# Patient Record
Sex: Male | Born: 1945
Health system: Southern US, Community
[De-identification: ages and names within clinical notes are randomized; demographics above are authoritative.]

## PROBLEM LIST (undated history)

## (undated) DIAGNOSIS — N200 Calculus of kidney: Secondary | ICD-10-CM

## (undated) DIAGNOSIS — R238 Other skin changes: Secondary | ICD-10-CM

## (undated) DIAGNOSIS — B019 Varicella without complication: Secondary | ICD-10-CM

## (undated) DIAGNOSIS — I10 Essential (primary) hypertension: Secondary | ICD-10-CM

## (undated) HISTORY — DX: Varicella without complication: B01.9

## (undated) HISTORY — DX: Calculus of kidney: N20.0

## (undated) HISTORY — PX: SKIN SURGERY: SHX2413

---

## 2013-11-09 ENCOUNTER — Ambulatory Visit: Payer: Self-pay | Admitting: Unknown Physician Specialty

## 2015-03-18 HISTORY — PX: ROTATOR CUFF REPAIR: SHX139

## 2016-01-03 ENCOUNTER — Telehealth: Payer: Self-pay | Admitting: Family Medicine

## 2016-01-03 NOTE — Telephone Encounter (Signed)
Thank you :)

## 2016-01-03 NOTE — Telephone Encounter (Signed)
Pt lvm stating he was returning Rasheeda's phone call. Please call back. Thank you

## 2016-01-08 ENCOUNTER — Encounter: Payer: Self-pay | Admitting: Family Medicine

## 2016-01-08 ENCOUNTER — Ambulatory Visit (INDEPENDENT_AMBULATORY_CARE_PROVIDER_SITE_OTHER): Payer: PPO | Admitting: Family Medicine

## 2016-01-08 VITALS — BP 136/78 | HR 65 | Temp 97.8°F | Ht 68.0 in | Wt 178.6 lb

## 2016-01-08 DIAGNOSIS — Z87442 Personal history of urinary calculi: Secondary | ICD-10-CM | POA: Diagnosis not present

## 2016-01-08 DIAGNOSIS — Z23 Encounter for immunization: Secondary | ICD-10-CM

## 2016-01-08 DIAGNOSIS — E663 Overweight: Secondary | ICD-10-CM | POA: Insufficient documentation

## 2016-01-08 NOTE — Patient Instructions (Signed)
Nice to meet you. We will have the return for lab work some morning when you've not eaten. Please continue to exercise daily. Please continue plenty of vegetables and proteins. Please monitor for recurrence of kidney stones. Once your lab work comes back we will determine when you need a follow-up.

## 2016-01-08 NOTE — Progress Notes (Signed)
Patient ID: Henry Skinner, male   DOB: Dec 13, 1945, 70 y.o.   MRN: [id number]  Tommi Rumps, MD Phone: [phone]  Henry Skinner is a 70 y.o. male who presents today for new patient visit and to establish care.  Overweight: Patient's BMI is 27. He notes he exercises daily by walking 1 to 2 miles. Diet consists of oatmeal-based cereal for breakfast. Eats 3 meals a day. Half of which are vegetables. Half protein. Some grains.  History of kidney stones: Patient notes 3-4 years ago had 2 kidney stones. States they were from the minerals in his well water. He has been using different water and has not had recurrence since then. No urinary symptoms.  Active Ambulatory Problems    Diagnosis Date Noted  . Overweight 01/08/2016  . History of kidney stones 01/08/2016   Resolved Ambulatory Problems    Diagnosis Date Noted  . No Resolved Ambulatory Problems   Past Medical History  Diagnosis Date  . Chickenpox   . Kidney stones     Family History  Problem Relation Age of Onset  . Alcoholism Brother   . Colon cancer Maternal Uncle     Social History   Social History  . Marital Status: Single    Spouse Name: N/A  . Number of Children: N/A  . Years of Education: N/A   Occupational History  . Not on file.   Social History Main Topics  . Smoking status: Never Smoker   . Smokeless tobacco: Not on file  . Alcohol Use: 0.6 oz/week    1 Standard drinks or equivalent per week     Comment: 1 beer a day   . Drug Use: Yes     Comment: marijuana, rarely  . Sexual Activity: Not on file   Other Topics Concern  . Not on file   Social History Narrative  . No narrative on file    ROS   General:  Negative for nexplained weight loss, fever Skin: Negative for new or changing mole, sore that won't heal HEENT: Negative for trouble hearing, trouble seeing, ringing in ears, mouth sores, hoarseness, change in voice, dysphagia. CV:  Negative for chest pain, dyspnea, edema,  palpitations Resp: Negative for cough, dyspnea, hemoptysis GI: Negative for nausea, vomiting, diarrhea, constipation, abdominal pain, melena, hematochezia. GU: Negative for dysuria, incontinence, urinary hesitance, hematuria, vaginal or penile discharge, polyuria, sexual difficulty, lumps in testicle or breasts MSK: Negative for muscle cramps or aches, joint pain or swelling Neuro: Negative for headaches, weakness, numbness, dizziness, passing out/fainting Psych: Negative for depression, anxiety, memory problems  Objective  Physical Exam Filed Vitals:   01/08/16 0929  BP: 136/78  Pulse: 65  Temp: 97.8 F (36.6 C)    BP Readings from Last 3 Encounters:  01/08/16 136/78   Wt Readings from Last 3 Encounters:  01/08/16 178 lb 9.6 oz (81.012 kg)    Physical Exam  Constitutional: He is well-developed, well-nourished, and in no distress.  HENT:  Head: Normocephalic and atraumatic.  Right Ear: External ear normal.  Left Ear: External ear normal.  Mouth/Throat: Oropharynx is clear and moist. No oropharyngeal exudate.  Eyes: Conjunctivae are normal. Pupils are equal, round, and reactive to light.  Neck: Neck supple.  Cardiovascular: Normal rate, regular rhythm and normal heart sounds.  Exam reveals no gallop and no friction rub.   No murmur heard. Pulmonary/Chest: Effort normal and breath sounds normal. No respiratory distress. He has no wheezes. He has no rales.  Abdominal: Soft. Bowel sounds are normal.  He exhibits no distension. There is no tenderness. There is no rebound and no guarding.  Musculoskeletal: He exhibits no edema.  Lymphadenopathy:    He has no cervical adenopathy.  Neurological: He is alert. Gait normal.  Skin: Skin is dry. He is not diaphoretic.  Psychiatric: Mood and affect normal.     Assessment/Plan:   Overweight BMI of 27. Discussed continuing diet and exercise. Patient will continue to monitor. We'll check lipid panel and CMP.  History of kidney  stones Has not had recurrence of the last 3-4 years. Has changed his water system and this is been beneficial. No symptoms at this time. Patient will continue to monitor.    Orders Placed This Encounter  Procedures  . Tdap vaccine greater than or equal to 7yo IM  . Lipid Profile    Standing Status: Future     Number of Occurrences:      Standing Expiration Date: 01/07/2017  . Comp Met (CMET)    Standing Status: Future     Number of Occurrences:      Standing Expiration Date: 01/07/2017  . HgB A1c    Meds ordered this encounter  Medications  . Multiple Vitamin (MULTIVITAMIN) tablet    Sig: Take 1 tablet by mouth daily.   Maintenance-updated in health maintenance tab. Tdap given today. Stool cards given as well. Patient declined colonoscopy.  Tommi Rumps

## 2016-01-08 NOTE — Assessment & Plan Note (Signed)
Has not had recurrence of the last 3-4 years. Has changed his water system and this is been beneficial. No symptoms at this time. Patient will continue to monitor.

## 2016-01-08 NOTE — Progress Notes (Signed)
Pre visit review using our clinic review tool, if applicable. No additional management support is needed unless otherwise documented below in the visit note. 

## 2016-01-08 NOTE — Assessment & Plan Note (Signed)
BMI of 27. Discussed continuing diet and exercise. Patient will continue to monitor. We'll check lipid panel and CMP.

## 2016-01-17 ENCOUNTER — Other Ambulatory Visit (INDEPENDENT_AMBULATORY_CARE_PROVIDER_SITE_OTHER): Payer: PPO

## 2016-01-17 ENCOUNTER — Other Ambulatory Visit: Payer: PPO

## 2016-01-17 DIAGNOSIS — E663 Overweight: Secondary | ICD-10-CM | POA: Diagnosis not present

## 2016-01-17 LAB — COMPREHENSIVE METABOLIC PANEL
ALBUMIN: 4.2 g/dL (ref 3.5–5.2)
ALK PHOS: 62 U/L (ref 39–117)
ALT: 33 U/L (ref 0–53)
AST: 28 U/L (ref 0–37)
BILIRUBIN TOTAL: 0.9 mg/dL (ref 0.2–1.2)
BUN: 12 mg/dL (ref 6–23)
CALCIUM: 9.4 mg/dL (ref 8.4–10.5)
CO2: 30 meq/L (ref 19–32)
Chloride: 103 mEq/L (ref 96–112)
Creatinine, Ser: 1.02 mg/dL (ref 0.40–1.50)
GFR: 77.26 mL/min (ref >60.00)
Glucose, Bld: 99 mg/dL (ref 70–99)
Potassium: 4.1 mEq/L (ref 3.5–5.1)
Sodium: 140 mEq/L (ref 135–145)
TOTAL PROTEIN: 7.2 g/dL (ref 6.0–8.3)

## 2016-01-17 LAB — LIPID PANEL
CHOL/HDL RATIO: 3
CHOLESTEROL: 166 mg/dL (ref 0–200)
HDL: 51.5 mg/dL (ref >39.00)
LDL Cholesterol: 99 mg/dL (ref 0–99)
NonHDL: 114.66
TRIGLYCERIDES: 78 mg/dL (ref 0.0–149.0)
VLDL: 15.6 mg/dL (ref 0.0–40.0)

## 2016-02-01 ENCOUNTER — Ambulatory Visit (INDEPENDENT_AMBULATORY_CARE_PROVIDER_SITE_OTHER): Payer: PPO | Admitting: Family Medicine

## 2016-02-01 ENCOUNTER — Encounter: Payer: Self-pay | Admitting: Family Medicine

## 2016-02-01 VITALS — BP 126/74 | HR 78 | Temp 98.0°F | Ht 68.0 in | Wt 175.4 lb

## 2016-02-01 DIAGNOSIS — R220 Localized swelling, mass and lump, head: Secondary | ICD-10-CM | POA: Diagnosis not present

## 2016-02-01 DIAGNOSIS — K118 Other diseases of salivary glands: Secondary | ICD-10-CM

## 2016-02-01 NOTE — Assessment & Plan Note (Signed)
Patient with left parotid mass on exam. Prior CT scan reviewed and revealed likely neoplasm recommending biopsy at that time. Patient has not proceeded with this. We will have him return to see his previous ear nose and throat physician for further evaluation to determine management of this issue. He is given return precautions.

## 2016-02-01 NOTE — Progress Notes (Signed)
Pre visit review using our clinic review tool, if applicable. No additional management support is needed unless otherwise documented below in the visit note. 

## 2016-02-01 NOTE — Progress Notes (Signed)
Patient ID: Henry Skinner, male   DOB: Mar 20, 1946, 70 y.o.   MRN: [id number]  Tommi Rumps, MD Phone: [phone]  Henry Skinner is a 70 y.o. male who presents today for same-day visit.  Patient notes a mass just anterior to his left ear that has been present for a number of years. Previously evaluated by ENT for this and had a CT scan. At that time it appears that biopsy was recommended though the patient did not follow up on this. He notes over the last week he felt maybe it was causing a pressure sensation in his inner year, though this is resolved. Soft tissue swelling has slightly enlarged over the years. It is nonpainful. He denies hearing loss and tinnitus. No swallowing or speaking issues. No facial muscle changes or sensory changes in his face. CT scan was reviewed from 2014.  PMH: nonsmoker.   ROS see history of present illness  Objective  Physical Exam Filed Vitals:   02/01/16 1358  BP: 126/74  Pulse: 78  Temp: 98 F (36.7 C)    BP Readings from Last 3 Encounters:  02/01/16 126/74  01/08/16 136/78   Wt Readings from Last 3 Encounters:  02/01/16 175 lb 6.4 oz (79.561 kg)  01/08/16 178 lb 9.6 oz (81.012 kg)    Physical Exam  Constitutional: He is well-developed, well-nourished, and in no distress.  HENT:  Head: Normocephalic and atraumatic.  Right Ear: External ear normal.  Left Ear: External ear normal.  Mouth/Throat: Oropharynx is clear and moist. No oropharyngeal exudate.  Left parotid gland with mass noted that is nontender and nonerythematous, no fluctuance, no induration, mass is firm and freely mobile, no cervical lymphadenopathy  Eyes: Conjunctivae are normal. Pupils are equal, round, and reactive to light.  Neck: Neck supple.  Cardiovascular: Normal rate, regular rhythm and normal heart sounds.  Exam reveals no gallop and no friction rub.   No murmur heard. Pulmonary/Chest: Effort normal and breath sounds normal. No respiratory distress. He has no  wheezes. He has no rales.  Lymphadenopathy:    He has no cervical adenopathy.  Neurological: He is alert. Gait normal.  Cranial nerves V and VII intact  Skin: Skin is warm and dry. He is not diaphoretic.     Assessment/Plan: Please see individual problem list.  Parotid mass Patient with left parotid mass on exam. Prior CT scan reviewed and revealed likely neoplasm recommending biopsy at that time. Patient has not proceeded with this. We will have him return to see his previous ear nose and throat physician for further evaluation to determine management of this issue. He is given return precautions.    Orders Placed This Encounter  Procedures  . Ambulatory referral to ENT    Referral Priority:  Routine    Referral Type:  Consultation    Referral Reason:  Specialty Services Required    Requested Specialty:  Otolaryngology    Number of Visits Requested:  [place], MD [place]

## 2016-02-01 NOTE — Patient Instructions (Signed)
Nice to see you. We needed to be evaluated by an ear nose and throat physician. We will refer you back to see Dr. Tami Ribas. If you develop pain in the mass, issues hearing, trouble swallowing, or any new or changing symptoms please seek medical attention.

## 2016-02-04 ENCOUNTER — Other Ambulatory Visit: Payer: Self-pay | Admitting: Unknown Physician Specialty

## 2016-02-04 DIAGNOSIS — D37030 Neoplasm of uncertain behavior of the parotid salivary glands: Secondary | ICD-10-CM

## 2016-02-04 DIAGNOSIS — R221 Localized swelling, mass and lump, neck: Secondary | ICD-10-CM

## 2016-02-12 ENCOUNTER — Ambulatory Visit: Payer: PPO

## 2016-02-14 ENCOUNTER — Ambulatory Visit (INDEPENDENT_AMBULATORY_CARE_PROVIDER_SITE_OTHER): Payer: PPO | Admitting: Family Medicine

## 2016-02-14 ENCOUNTER — Encounter: Payer: Self-pay | Admitting: Family Medicine

## 2016-02-14 VITALS — BP 132/90 | HR 68 | Temp 98.0°F | Wt 175.5 lb

## 2016-02-14 DIAGNOSIS — W57XXXA Bitten or stung by nonvenomous insect and other nonvenomous arthropods, initial encounter: Secondary | ICD-10-CM

## 2016-02-14 DIAGNOSIS — T148 Other injury of unspecified body region: Secondary | ICD-10-CM | POA: Diagnosis not present

## 2016-02-14 MED ORDER — TRIAMCINOLONE ACETONIDE 0.1 % EX OINT: 1.0000 "application " | TOPICAL_OINTMENT | Freq: Two times a day (BID) | CUTANEOUS | Status: DC

## 2016-02-14 NOTE — Progress Notes (Signed)
Pre visit review using our clinic review tool, if applicable. No additional management support is needed unless otherwise documented below in the visit note. 

## 2016-02-14 NOTE — Assessment & Plan Note (Signed)
New problem. Patient with no signs or symptoms of Lyme, RMSF, ehrlichiosis. I informed him that the likelihood of tickborne illness is very small in the absence of other signs or symptoms. Will not proceed with antibiotics at this time. Advised supportive care and topical triamcinolone if he so desires.

## 2016-02-14 NOTE — Progress Notes (Signed)
   Subjective:  Patient ID: Henry Skinner, male    DOB: 11-09-1946  Age: 70 y.o. MRN: [id number]  CC: Tick bites  HPI:  70 year old male presents to clinic today with the above concern.  Tick bites  Patient reports that he was bitten by 2 tics on Monday.  One bite was located on the left medial thigh and the other was in the axilla.  He's had no fevers or chills since that time.  No focal rash.  No other symptoms.  He states that he's also suffers some other bites from chiggers.  He is concerned he may be developing Lyme disease or another tickborne illness and presents for evaluation.  Social Hx   Social History   Social History  . Marital Status: Single    Spouse Name: N/A  . Number of Children: N/A  . Years of Education: N/A   Social History Main Topics  . Smoking status: Never Smoker   . Smokeless tobacco: None  . Alcohol Use: 0.6 oz/week    1 Standard drinks or equivalent per week     Comment: 1 beer a day   . Drug Use: Yes     Comment: marijuana, rarely  . Sexual Activity: Not Asked   Other Topics Concern  . None   Social History Narrative   Review of Systems  Constitutional: Negative.   Gastrointestinal: Negative.   Skin: Negative for rash.       Tick bites.   Objective:  BP 132/90 mmHg  Pulse 68  Temp(Src) 98 F (36.7 C) (Oral)  Wt 175 lb 8 oz (79.606 kg)  SpO2 96%  BP/Weight 02/14/2016 02/01/2016 [id number]  Systolic BP [id number]  Diastolic BP 90 74 78  Wt. (Lbs) 175.5 175.4 178.6  BMI 26.69 26.68 27.16   Physical Exam  Constitutional: He is oriented to person, place, and time. He appears well-developed. No distress.  Pulmonary/Chest: Effort normal.  Neurological: He is alert and oriented to person, place, and time.  Skin:  Numerous bug bites noted. 2 areas from tick bites have mild surrounding erythema. No focal rash.  Psychiatric: He has a normal mood and affect.  Vitals reviewed.  Lab Results  Component Value Date   GLUCOSE  99 01/17/2016   CHOL 166 01/17/2016   TRIG 78.0 01/17/2016   HDL 51.50 01/17/2016   LDLCALC 99 01/17/2016   ALT 33 01/17/2016   AST 28 01/17/2016   NA 140 01/17/2016   K 4.1 01/17/2016   CL 103 01/17/2016   CREATININE 1.02 01/17/2016   BUN 12 01/17/2016   CO2 30 01/17/2016    Assessment & Plan:   Problem List Items Addressed This Visit    Tick bite - Primary    New problem. Patient with no signs or symptoms of Lyme, RMSF, ehrlichiosis. I informed him that the likelihood of tickborne illness is very small in the absence of other signs or symptoms. Will not proceed with antibiotics at this time. Advised supportive care and topical triamcinolone if he so desires.         Meds ordered this encounter  Medications  . VITAMIN D, CHOLECALCIFEROL, PO    Sig: Take 2,000 Units by mouth.  . triamcinolone ointment (KENALOG) 0.1 %    Sig: Apply 1 application topically 2 (two) times daily.    Dispense:  30 g    Refill:  0    Follow-up: PRN  [place]

## 2016-02-14 NOTE — Patient Instructions (Signed)
Use the topical ointment twice daily as needed for your bites.  No need for treatment regarding the tick bite at this time.  Please let us know if you worsen or something changes.  Take care  Dr. Lacinda Axon

## 2016-02-18 ENCOUNTER — Ambulatory Visit
Admission: RE | Admit: 2016-02-18 | Discharge: 2016-02-18 | Disposition: A | Discharge status: Home / Self Care | Payer: PPO | Source: Ambulatory Visit | Attending: Unknown Physician Specialty | Admitting: Unknown Physician Specialty

## 2016-02-18 DIAGNOSIS — D37030 Neoplasm of uncertain behavior of the parotid salivary glands: Secondary | ICD-10-CM

## 2016-02-18 DIAGNOSIS — R221 Localized swelling, mass and lump, neck: Secondary | ICD-10-CM | POA: Diagnosis not present

## 2016-02-18 MED ORDER — IOPAMIDOL (ISOVUE-370) INJECTION 76%
75.0000 mL | Freq: Once | INTRAVENOUS | Status: AC | PRN
  Administered 2016-02-18: 75 mL via INTRAVENOUS

## 2016-02-20 DIAGNOSIS — D37030 Neoplasm of uncertain behavior of the parotid salivary glands: Secondary | ICD-10-CM | POA: Diagnosis not present

## 2016-02-25 ENCOUNTER — Encounter
Admission: RE | Admit: 2016-02-25 | Discharge: 2016-02-25 | Disposition: A | Discharge status: Home / Self Care | Payer: PPO | Source: Ambulatory Visit | Attending: Unknown Physician Specialty | Admitting: Unknown Physician Specialty

## 2016-02-25 DIAGNOSIS — Z01810 Encounter for preprocedural cardiovascular examination: Secondary | ICD-10-CM | POA: Insufficient documentation

## 2016-02-25 HISTORY — DX: Other skin changes: R23.8

## 2016-02-25 NOTE — Patient Instructions (Signed)
  Your procedure is scheduled on: 03/04/16 Tues Report to Day Surgery.2nd floor medical mall To find out your arrival time please call [phone] between 1PM - 3PM on 03/03/16 Mon.  Remember: Instructions that are not followed completely may result in serious medical risk, up to and including death, or upon the discretion of your surgeon and anesthesiologist your surgery may need to be rescheduled.    _x___ 1. Do not eat food or drink liquids after midnight. No gum chewing or hard candies.     ____ 2. No Alcohol for 24 hours before or after surgery.   ____ 3. Bring all medications with you on the day of surgery if instructed.    _x__ 4. Notify your doctor if there is any change in your medical condition     (cold, fever, infections).     Do not wear jewelry, make-up, hairpins, clips or nail polish.  Do not wear lotions, powders, or perfumes. You may wear deodorant.  Do not shave 48 hours prior to surgery. Men may shave face and neck.  Do not bring valuables to the hospital.    [facility] is not responsible for any belongings or valuables.               Contacts, dentures or bridgework may not be worn into surgery.  Leave your suitcase in the car. After surgery it may be brought to your room.  For patients admitted to the hospital, discharge time is determined by your                treatment team.   Patients discharged the day of surgery will not be allowed to drive home.   Please read over the following fact sheets that you were given:      ____ Take these medicines the morning of surgery with A SIP OF WATER:    1. none  2.   3.   4.  5.  6.  ____ Fleet Enema (as directed)   _x___ Use CHG Soap as directed  ____ Use inhalers on the day of surgery  ____ Stop metformin 2 days prior to surgery    ____ Take 1/2 of usual insulin dose the night before surgery and none on the morning of surgery.   ____ Stop Coumadin/Plavix/aspirin on   ____ Stop Anti-inflammatories on     ____ Stop supplements until after surgery.    ____ Bring C-Pap to the hospital.

## 2016-02-26 ENCOUNTER — Other Ambulatory Visit: Payer: PPO

## 2016-03-04 ENCOUNTER — Ambulatory Visit
Admission: RE | Admit: 2016-03-04 | Discharge: 2016-03-04 | Disposition: A | Discharge status: Home / Self Care | Payer: PPO | Source: Ambulatory Visit | Attending: Unknown Physician Specialty | Admitting: Unknown Physician Specialty

## 2016-03-04 ENCOUNTER — Encounter
Admission: RE | Disposition: A | Discharge status: Home / Self Care | Payer: Self-pay | Source: Ambulatory Visit | Attending: Unknown Physician Specialty

## 2016-03-04 ENCOUNTER — Ambulatory Visit: Payer: PPO | Admitting: Anesthesiology

## 2016-03-04 ENCOUNTER — Encounter: Payer: Self-pay | Admitting: Anesthesiology

## 2016-03-04 DIAGNOSIS — D490 Neoplasm of unspecified behavior of digestive system: Secondary | ICD-10-CM | POA: Diagnosis not present

## 2016-03-04 DIAGNOSIS — Z9889 Other specified postprocedural states: Secondary | ICD-10-CM | POA: Insufficient documentation

## 2016-03-04 DIAGNOSIS — Z01810 Encounter for preprocedural cardiovascular examination: Secondary | ICD-10-CM | POA: Diagnosis not present

## 2016-03-04 DIAGNOSIS — Z8249 Family history of ischemic heart disease and other diseases of the circulatory system: Secondary | ICD-10-CM | POA: Insufficient documentation

## 2016-03-04 DIAGNOSIS — K118 Other diseases of salivary glands: Secondary | ICD-10-CM

## 2016-03-04 DIAGNOSIS — K119 Disease of salivary gland, unspecified: Secondary | ICD-10-CM | POA: Diagnosis not present

## 2016-03-04 DIAGNOSIS — D110 Benign neoplasm of parotid gland: Secondary | ICD-10-CM | POA: Insufficient documentation

## 2016-03-04 HISTORY — PX: PAROTIDECTOMY: SHX2163

## 2016-03-04 LAB — URINE DRUG SCREEN, QUALITATIVE (ARMC ONLY)
Amphetamines, Ur Screen: NOT DETECTED
BARBITURATES, UR SCREEN: NOT DETECTED
Benzodiazepine, Ur Scrn: NOT DETECTED
CANNABINOID 50 NG, UR ~~LOC~~: NOT DETECTED
COCAINE METABOLITE, UR ~~LOC~~: NOT DETECTED
MDMA (ECSTASY) UR SCREEN: NOT DETECTED
Methadone Scn, Ur: NOT DETECTED
OPIATE, UR SCREEN: NOT DETECTED
PHENCYCLIDINE (PCP) UR S: NOT DETECTED
TRICYCLIC, UR SCREEN: NOT DETECTED

## 2016-03-04 SURGERY — EXCISION, PAROTID GLAND
Anesthesia: General | Laterality: Left | Wound class: Clean

## 2016-03-04 MED ORDER — ONDANSETRON HCL 4 MG/2ML IJ SOLN: 4.0000 mg | Freq: Once | INTRAMUSCULAR | Status: DC | PRN

## 2016-03-04 MED ORDER — FAMOTIDINE 20 MG PO TABS
20.0000 mg | ORAL_TABLET | Freq: Once | ORAL | Status: AC
  Administered 2016-03-04: 20 mg via ORAL

## 2016-03-04 MED ORDER — PROPOFOL 10 MG/ML IV BOLUS
INTRAVENOUS | Status: DC | PRN
  Administered 2016-03-04: 170 mg via INTRAVENOUS

## 2016-03-04 MED ORDER — OXYCODONE-ACETAMINOPHEN 5-325 MG PO TABS: 1.0000 | ORAL_TABLET | ORAL | Status: DC | PRN

## 2016-03-04 MED ORDER — GLYCOPYRROLATE 0.2 MG/ML IJ SOLN
INTRAMUSCULAR | Status: DC | PRN
  Administered 2016-03-04: 0.2 mg via INTRAVENOUS

## 2016-03-04 MED ORDER — LIDOCAINE-EPINEPHRINE 1 %-1:100000 IJ SOLN
INTRAMUSCULAR | Status: AC
  Filled 2016-03-04: qty 1

## 2016-03-04 MED ORDER — PHENYLEPHRINE HCL 10 MG/ML IJ SOLN
INTRAMUSCULAR | Status: DC | PRN
  Administered 2016-03-04 (×2): 50 ug via INTRAVENOUS

## 2016-03-04 MED ORDER — BACITRACIN ZINC 500 UNIT/GM EX OINT
TOPICAL_OINTMENT | CUTANEOUS | Status: AC
  Filled 2016-03-04: qty 28.35

## 2016-03-04 MED ORDER — MIDAZOLAM HCL 2 MG/2ML IJ SOLN
INTRAMUSCULAR | Status: DC | PRN
  Administered 2016-03-04: 2 mg via INTRAVENOUS

## 2016-03-04 MED ORDER — LACTATED RINGERS IV SOLN
INTRAVENOUS | Status: DC
  Administered 2016-03-04 (×2): via INTRAVENOUS

## 2016-03-04 MED ORDER — ONDANSETRON HCL 4 MG/2ML IJ SOLN
INTRAMUSCULAR | Status: DC | PRN
  Administered 2016-03-04: 4 mg via INTRAVENOUS

## 2016-03-04 MED ORDER — CEPHALEXIN 500 MG PO CAPS: 500.0000 mg | ORAL_CAPSULE | Freq: Two times a day (BID) | ORAL | Status: DC

## 2016-03-04 MED ORDER — FAMOTIDINE 20 MG PO TABS
ORAL_TABLET | ORAL | Status: AC
  Administered 2016-03-04: 20 mg via ORAL
  Filled 2016-03-04: qty 1

## 2016-03-04 MED ORDER — FENTANYL CITRATE (PF) 100 MCG/2ML IJ SOLN
25.0000 ug | INTRAMUSCULAR | Status: DC | PRN
  Administered 2016-03-04 (×4): 25 ug via INTRAVENOUS

## 2016-03-04 MED ORDER — FENTANYL CITRATE (PF) 100 MCG/2ML IJ SOLN
INTRAMUSCULAR | Status: DC | PRN
  Administered 2016-03-04: 50 ug via INTRAVENOUS
  Administered 2016-03-04: 100 ug via INTRAVENOUS
  Administered 2016-03-04: 50 ug via INTRAVENOUS
  Administered 2016-03-04: 100 ug via INTRAVENOUS

## 2016-03-04 MED ORDER — DEXAMETHASONE SODIUM PHOSPHATE 10 MG/ML IJ SOLN
INTRAMUSCULAR | Status: DC | PRN
  Administered 2016-03-04: 10 mg via INTRAVENOUS

## 2016-03-04 MED ORDER — LIDOCAINE-EPINEPHRINE 1 %-1:100000 IJ SOLN
INTRAMUSCULAR | Status: DC | PRN
  Administered 2016-03-04: 4 mL

## 2016-03-04 MED ORDER — SUCCINYLCHOLINE CHLORIDE 20 MG/ML IJ SOLN
INTRAMUSCULAR | Status: DC | PRN
  Administered 2016-03-04: 100 mg via INTRAVENOUS

## 2016-03-04 MED ORDER — FENTANYL CITRATE (PF) 100 MCG/2ML IJ SOLN
INTRAMUSCULAR | Status: AC
  Filled 2016-03-04: qty 2

## 2016-03-04 MED ORDER — LIDOCAINE HCL (CARDIAC) 20 MG/ML IV SOLN
INTRAVENOUS | Status: DC | PRN
  Administered 2016-03-04: 80 mg via INTRAVENOUS

## 2016-03-04 SURGICAL SUPPLY — 42 items
ADHESIVE MASTISOL STRL (MISCELLANEOUS) IMPLANT
BLADE SURG 15 STRL LF DISP TIS (BLADE) ×1 IMPLANT
BLADE SURG 15 STRL SS (BLADE) ×2
CANISTER SUCT 1200ML W/VALVE (MISCELLANEOUS) ×3 IMPLANT
CORD BIP STRL DISP 12FT (MISCELLANEOUS) ×3 IMPLANT
DRAIN TLS ROUND 10FR (DRAIN) IMPLANT
DRAPE MAG INST 16X20 L/F (DRAPES) ×3 IMPLANT
DRAPE SURG 17X11 SM STRL (DRAPES) ×3 IMPLANT
DRSG TEGADERM 2-3/8X2-3/4 SM (GAUZE/BANDAGES/DRESSINGS) ×9 IMPLANT
ELECT CAUTERY BLADE TIP 2.5 (TIP) ×3
ELECT EMG 20MM DUAL (MISCELLANEOUS) ×6
ELECT NEEDLE 20X.3 GREEN (MISCELLANEOUS) ×3
ELECT REM PT RETURN 9FT ADLT (ELECTROSURGICAL) ×3
ELECTRODE CAUTERY BLDE TIP 2.5 (TIP) ×1 IMPLANT
ELECTRODE EMG 20MM DUAL (MISCELLANEOUS) ×2 IMPLANT
ELECTRODE NEEDLE 20X.3 GREEN (MISCELLANEOUS) ×1 IMPLANT
ELECTRODE REM PT RTRN 9FT ADLT (ELECTROSURGICAL) ×1 IMPLANT
FORCEPS JEWEL BIP 4-3/4 STR (INSTRUMENTS) ×3 IMPLANT
GLOVE BIO SURGEON STRL SZ7.5 (GLOVE) ×6 IMPLANT
GOWN STRL REUS W/ TWL LRG LVL3 (GOWN DISPOSABLE) ×2 IMPLANT
GOWN STRL REUS W/TWL LRG LVL3 (GOWN DISPOSABLE) ×4
HARMONIC SCALPEL FOCUS (MISCELLANEOUS) ×3 IMPLANT
HEMOSTAT SURGICEL 2X3 (HEMOSTASIS) ×3 IMPLANT
HOOK STAY 5M SHARP BLUNT 3316- (MISCELLANEOUS) ×3 IMPLANT
JACKSON PRATT 7MM (INSTRUMENTS) ×3 IMPLANT
KIT RM TURNOVER STRD PROC AR (KITS) ×3 IMPLANT
LABEL OR SOLS (LABEL) IMPLANT
LIQUID BAND (GAUZE/BANDAGES/DRESSINGS) ×3 IMPLANT
MARKER SKIN DUAL TIP RULER LAB (MISCELLANEOUS) ×3 IMPLANT
NS IRRIG 500ML POUR BTL (IV SOLUTION) ×3 IMPLANT
PACK HEAD/NECK (MISCELLANEOUS) ×3 IMPLANT
PROBE MONO 100X0.75 ELECT 1.9M (MISCELLANEOUS) ×3 IMPLANT
SPONGE KITTNER 5P (MISCELLANEOUS) ×6 IMPLANT
SPONGE XRAY 4X4 16PLY STRL (MISCELLANEOUS) ×6 IMPLANT
STAPLER SKIN PROX 35W (STAPLE) ×3 IMPLANT
SUT PROLENE 5 0 PS 3 (SUTURE) ×3 IMPLANT
SUT SILK 2 0 (SUTURE) ×2
SUT SILK 2-0 30XBRD TIE 12 (SUTURE) ×1 IMPLANT
SUT SILK 3 0 (SUTURE) ×2
SUT SILK 3-0 18XBRD TIE 12 (SUTURE) ×1 IMPLANT
SUT VIC AB 4-0 RB1 18 (SUTURE) ×6 IMPLANT
SYSTEM CHEST DRAIN TLS 7FR (DRAIN) IMPLANT

## 2016-03-04 NOTE — Anesthesia Postprocedure Evaluation (Signed)
Anesthesia Post Note  Patient: Henry Skinner  Procedure(s) Performed: Procedure(s) (LRB): PAROTIDECTOMY (Left)  Patient location during evaluation: PACU Anesthesia Type: General Level of consciousness: awake and alert Pain management: pain level controlled Vital Signs Assessment: post-procedure vital signs reviewed and stable Respiratory status: spontaneous breathing, nonlabored ventilation, respiratory function stable and patient connected to nasal cannula oxygen Cardiovascular status: blood pressure returned to baseline and stable Postop Assessment: no signs of nausea or vomiting Anesthetic complications: no    Last Vitals:  Filed Vitals:   03/04/16 1021 03/04/16 1040  BP: 143/88 131/82  Pulse: 80 88  Temp: 36.9 C   Resp: 18 18    Last Pain:  Filed Vitals:   03/04/16 1104  PainSc: 0-No pain                 Lavida Patch S

## 2016-03-04 NOTE — H&P (Signed)
  H+P  Reviewed and will be scanned in later. No changes noted. 

## 2016-03-04 NOTE — Transfer of Care (Signed)
Immediate Anesthesia Transfer of Care Note  Patient: Henry Skinner  Procedure(s) Performed: Procedure(s): PAROTIDECTOMY (Left)  Patient Location: PACU  Anesthesia Type:General  Level of Consciousness: awake, alert  and oriented  Airway & Oxygen Therapy: Patient Spontanous Breathing and Patient connected to face mask oxygen  Post-op Assessment: Report given to RN and Post -op Vital signs reviewed and stable  Post vital signs: Reviewed and stable  Last Vitals:  Filed Vitals:   03/04/16 0632  BP: 148/89  Pulse: 68  Temp: 36.9 C  Resp: 16    Complications: No apparent anesthesia complications

## 2016-03-04 NOTE — Discharge Instructions (Signed)
AMBULATORY SURGERY  °DISCHARGE INSTRUCTIONS ° ° °1) The drugs that you were given will stay in your system until tomorrow so for the next 24 hours you should not: ° °A) Drive an automobile °B) Make any legal decisions °C) Drink any alcoholic beverage ° ° °2) You may resume regular meals tomorrow.  Today it is better to start with liquids and gradually work up to solid foods. ° °You may eat anything you prefer, but it is better to start with liquids, then soup and crackers, and gradually work up to solid foods. ° ° °3) Please notify your doctor immediately if you have any unusual bleeding, trouble breathing, redness and pain at the surgery site, drainage, fever, or pain not relieved by medication. ° ° ° °4) Additional Instructions: ° ° ° ° ° ° ° °Please contact your physician with any problems or Same Day Surgery at [phone], Monday through Friday 6 am to 4 pm, or [place] [facility] [place] [facility] number at [phone].     ° ° °JP Drain Totals °· Bring this sheet to all of your post-operative appointments while you have your drains. °· Please measure your drains by CC's or ML's. °· Make sure you drain and measure your JP Drains 3 times per day. °At the end of each day, add up totals for the left side and add up totals for the right side. ° ° ° ° ° °   ( 9 am )     ( 3 pm )        ( 9 pm )                °Date L  R  L  R  L  R  Total L/R  °               °               °               °               °               °               °               °               °               °               °               °               ° °

## 2016-03-04 NOTE — Op Note (Signed)
OPERATIVE REPORT  DATE OF SURGERY: 03/04/2016  PATIENT:  Henry Skinner,  70 y.o. male  PRE-OPERATIVE DIAGNOSIS:  LEFT PAROTID MASS  POST-OPERATIVE DIAGNOSIS:  LEFT PAROTID MASS  PROCEDURE:  Procedure(s): Left Surperficial PAROTIDECTOMY; facial nerve monitoring one half hours  SURGEON:  Roena Malady, MD   Asst:  Juengel  Anes:  General  EBL: <20cc  Comp:  none  Findings:  3cm left parotid mass  Procedure: Bengy was identified in the holding area taken the operating room placed in supine position. The facial nerve monitor was applied to the left face and remained on throughout the procedure. The table was then turned 90 incision line was marked in the preauricular crease down onto the upper neck. A local anesthetic of 1% lidocaine with 1 100,000 units of epinephrine was used to inject along the incision line a total of 6 cc was used. The left face was then prepped and draped sterilely. Incision was made down to the platysma muscle inferiorly and the superficial muscular aponeurotic system superiorly. A supra-SMAS flap was then elevated out onto the face. The obvious mass was identified in the tail of parotid measuring approximately 3 cm. The fascia was divided between the tragal cartilage in the parotid using the Harmonic scalpel. This incision carried down to the anterior border of the sternocleidomastoid muscle. The parotid was gently dissected anteriorly using the Harmonic scalpel. The facial nerve was identified as it exited the stylomastoid foramen was stimulated and all branches were intact. Beginning with the upper branch the parotid was gently dissected off of the nerve using the microbipolar and the monitor scalpel. The middle and inferior branches were also identified and the gland was peeled gently off of the nerve removing the mass in its entirety. The nerves are then traced out distally as they does dove deep into the facial musculature on the anterior face. The fascia  was then divided anterior to the mass removed and the superficial lobe in its entirety. Any small bleeding points were cauterized using the microbipolar. The wounds and copiously irrigated with saline there is no active bleeding. The facial nerve was stimulated at the end of the case all branches were intact and functioning. A #7 Jackson-Pratt drain was brought out of the wound inferiorly. The SMAS layer was reapproximated using 4-0 Vicryl the subcutaneous tissues were closed using 4-0 Vicryl and the skin was closed using skin glue. The Jackson-Pratt was affixed to the skin using a 2-0 silk suture patient was in return anesthesia where he was awakened in the operating room and taken recovery in stable condition  Cultures: None  Specimens: Left superficial parotid mass   Dispo:  Good  Plan:  Ice, elevation, analgesia, 23 hour suction drain   Henry Skinner 03/04/2016 9:07 AM

## 2016-03-04 NOTE — Anesthesia Preprocedure Evaluation (Signed)
Anesthesia Evaluation  Patient identified by MRN, date of birth, ID band Patient awake    Reviewed: Allergy & Precautions, NPO status , Patient's Chart, lab work & pertinent test results, reviewed documented beta blocker date and time   Airway Mallampati: II  TM Distance: >3 FB     Dental  (+) Chipped   Pulmonary           Cardiovascular      Neuro/Psych    GI/Hepatic   Endo/Other    Renal/GU Renal InsufficiencyRenal disease     Musculoskeletal   Abdominal   Peds  Hematology   Anesthesia Other Findings   Reproductive/Obstetrics                             Anesthesia Physical Anesthesia Plan  ASA: II  Anesthesia Plan: General   Post-op Pain Management:    Induction: Intravenous  Airway Management Planned: Oral ETT  Additional Equipment:   Intra-op Plan:   Post-operative Plan:   Informed Consent: I have reviewed the patients History and Physical, chart, labs and discussed the procedure including the risks, benefits and alternatives for the proposed anesthesia with the patient or authorized representative who has indicated his/her understanding and acceptance.     Plan Discussed with: CRNA  Anesthesia Plan Comments:         Anesthesia Quick Evaluation

## 2016-03-04 NOTE — Anesthesia Procedure Notes (Signed)
Procedure Name: Intubation Performed by: Demetrius Charity Pre-anesthesia Checklist: Patient identified, Patient being monitored, Timeout performed, Emergency Drugs available and Suction available Patient Re-evaluated:Patient Re-evaluated prior to inductionOxygen Delivery Method: Circle system utilized Preoxygenation: Pre-oxygenation with 100% oxygen Intubation Type: IV induction Ventilation: Two handed mask ventilation required Laryngoscope Size: Mac and 3 Grade View: Grade II Tube type: Oral Tube size: 7.5 mm Number of attempts: 1 Airway Equipment and Method: Stylet Placement Confirmation: ETT inserted through vocal cords under direct vision,  positive ETCO2 and breath sounds checked- equal and bilateral Secured at: 22 cm Tube secured with: Tape Dental Injury: Teeth and Oropharynx as per pre-operative assessment

## 2016-03-06 LAB — SURGICAL PATHOLOGY

## 2016-04-02 ENCOUNTER — Ambulatory Visit (INDEPENDENT_AMBULATORY_CARE_PROVIDER_SITE_OTHER): Payer: PPO | Admitting: Family Medicine

## 2016-04-02 ENCOUNTER — Encounter: Payer: Self-pay | Admitting: Family Medicine

## 2016-04-02 VITALS — BP 120/72 | HR 88 | Temp 98.5°F | Ht 68.0 in | Wt 172.4 lb

## 2016-04-02 DIAGNOSIS — K118 Other diseases of salivary glands: Secondary | ICD-10-CM

## 2016-04-02 DIAGNOSIS — R220 Localized swelling, mass and lump, head: Secondary | ICD-10-CM

## 2016-04-02 DIAGNOSIS — G44209 Tension-type headache, unspecified, not intractable: Secondary | ICD-10-CM

## 2016-04-02 DIAGNOSIS — R519 Headache, unspecified: Secondary | ICD-10-CM | POA: Insufficient documentation

## 2016-04-02 DIAGNOSIS — Z1211 Encounter for screening for malignant neoplasm of colon: Secondary | ICD-10-CM | POA: Diagnosis not present

## 2016-04-02 DIAGNOSIS — R51 Headache: Secondary | ICD-10-CM

## 2016-04-02 NOTE — Progress Notes (Signed)
Patient ID: Henry Skinner, male   DOB: Jun 24, 1946, 70 y.o.   MRN: [id number]  Tommi Rumps, MD Phone: [phone]  Henry Skinner is a 70 y.o. male who presents today for follow-up.  Parotid mass, left: Patient had surgery with ENT for removal. Reports a benign lesion. Notes minimal discomfort in the area of the surgery that is improving. Does note some mild numbness of his left external ear though this is improving as well. He has followed up with ENT several times in the last month.  He also notes a dull headache since surgery. Started about a week after the surgery. Was all over his head for 2 days and then left-sided. States he spoke with ENT about this and they noted is likely related to his positioning during the surgery. Notes this is improving daily. It is mildly dull. Nothing makes it worse. No numbness, weakness, or vision changes. He does note he has a history of headaches in the past though none in several years. Patient also does note occasional sneezing and wonders if this could be related to allergies.  PMH: nonsmoker.   ROS see history of present illness  Objective  Physical Exam Filed Vitals:   04/02/16 0956  BP: 120/72  Pulse: 88  Temp: 98.5 F (36.9 C)    BP Readings from Last 3 Encounters:  04/02/16 120/72  03/04/16 131/82  02/25/16 133/74   Wt Readings from Last 3 Encounters:  04/02/16 172 lb 6.4 oz (78.2 kg)  03/04/16 170 lb (77.111 kg)  02/25/16 170 lb (77.111 kg)    Physical Exam  Constitutional: He is well-developed, well-nourished, and in no distress.  HENT:  Head: Normocephalic and atraumatic.  Right Ear: External ear normal.  Left Ear: External ear normal.  Mouth/Throat: Oropharynx is clear and moist. No oropharyngeal exudate.  Well healing surgical site in left preauricular area, nontender, no erythema or fluctuance, normal TMs bilaterally  Eyes: Conjunctivae are normal. Pupils are equal, round, and reactive to light.  Neck: Neck supple.    Cardiovascular: Normal rate, regular rhythm and normal heart sounds.   Pulmonary/Chest: Effort normal and breath sounds normal.  Neurological: He is alert.  Minimal decreased sensation in left external ear, otherwise CN 2-12 intact, 5/5 strength in bilateral biceps, triceps, grip, quads, hamstrings, plantar and dorsiflexion, sensation to light touch intact in bilateral UE and LE, normal gait, 2+ patellar reflexes  Skin: Skin is warm and dry. He is not diaphoretic.     Assessment/Plan: Please see individual problem list.  Parotid mass Recently removed. Well-healing site. Reports was benign. Suspect mild decreased sensation left ear likely related to his surgery. He'll continue to monitor. If no improvement he'll let us know. Continue to follow with ENT.  Headache Minimal headache likely related to positioning from surgery. Is slowly improving. Discussed that this could potentially be related to allergies that he has no congestion. Discussed using Flonase if he would like. Neurologically intact. Discussed continuing to monitor as this continues to improve. If does not continue to improve over the next several weeks or if it changes or develops new symptoms he will let us know. Given return precautions.    Orders Placed This Encounter  Procedures  . Fecal occult blood, imunochemical    Standing Status: Future     Number of Occurrences:      Standing Expiration Date: 04/02/2017    No orders of the defined types were placed in this encounter.    # Healthcare maintenance: Patient reports he did  previously drop off fecal occult blood tests though there appears to be no result in the system. We will have him repeat these. Future order has been placed.   Tommi Rumps, MD [place]

## 2016-04-02 NOTE — Assessment & Plan Note (Signed)
Recently removed. Well-healing site. Reports was benign. Suspect mild decreased sensation left ear likely related to his surgery. He'll continue to monitor. If no improvement he'll let us know. Continue to follow with ENT.

## 2016-04-02 NOTE — Progress Notes (Signed)
Pre visit review using our clinic review tool, if applicable. No additional management support is needed unless otherwise documented below in the visit note. 

## 2016-04-02 NOTE — Assessment & Plan Note (Signed)
Minimal headache likely related to positioning from surgery. Is slowly improving. Discussed that this could potentially be related to allergies that he has no congestion. Discussed using Flonase if he would like. Neurologically intact. Discussed continuing to monitor as this continues to improve. If does not continue to improve over the next several weeks or if it changes or develops new symptoms he will let us know. Given return precautions.

## 2016-04-02 NOTE — Patient Instructions (Signed)
Nice to see you. Please continue to monitor your headaches. If they do not continue to improve over the next 2 weeks or if they change or worsen please let us know. Review of all numbness, weakness, vision changes, sudden onset headache, worsening headaches, or any new or changing symptoms please seek medical attention.

## 2016-07-03 ENCOUNTER — Encounter (INDEPENDENT_AMBULATORY_CARE_PROVIDER_SITE_OTHER): Payer: Self-pay

## 2016-07-03 ENCOUNTER — Ambulatory Visit (INDEPENDENT_AMBULATORY_CARE_PROVIDER_SITE_OTHER): Payer: PPO | Admitting: Family Medicine

## 2016-07-03 ENCOUNTER — Encounter: Payer: Self-pay | Admitting: Family Medicine

## 2016-07-03 VITALS — BP 104/66 | HR 76 | Temp 98.2°F | Wt 173.2 lb

## 2016-07-03 DIAGNOSIS — E663 Overweight: Secondary | ICD-10-CM | POA: Diagnosis not present

## 2016-07-03 DIAGNOSIS — K118 Other diseases of salivary glands: Secondary | ICD-10-CM

## 2016-07-03 DIAGNOSIS — R220 Localized swelling, mass and lump, head: Secondary | ICD-10-CM

## 2016-07-03 DIAGNOSIS — L570 Actinic keratosis: Secondary | ICD-10-CM | POA: Diagnosis not present

## 2016-07-03 NOTE — Patient Instructions (Signed)
Nice to see you. Please complete the stool cards.  We will refer you to dermatology. Please continue to work on diet and exercise.

## 2016-07-03 NOTE — Assessment & Plan Note (Signed)
Doing well status post removal of this. Sensation is continued to improve. He will continue to monitor.

## 2016-07-03 NOTE — Assessment & Plan Note (Signed)
Weight is unchanged. Encourage diet and exercise. We'll continue to monitor.

## 2016-07-03 NOTE — Progress Notes (Signed)
  Tommi Rumps, MD Phone: [phone]  Henry Skinner is a 70 y.o. male who presents today for follow-up.  Overweight: Patient notes he is still walking. He has cut back on his meat by about 65-70%. Also cut down on milk intake. Eating more vegetables and grains. Weight is about stable.  Parotid mass: Was found to be benign. Notes his numbness has noticeably improved and is minimal at this time. No hearing issues.  Skin lesions: Patient notes an area on his right inner calf that is rough and looks like a scar. Has been there for some time. Has not worsened or improved. Also notes an area on his left anterior shoulder that is erythematous and rough. Has been there for about a year and has not changed or improved. Also has scattered actinic keratoses on his bilateral arms.  PMH: nonsmoker.   ROS see history of present illness  Objective  Physical Exam Vitals:   07/03/16 0958  BP: 104/66  Pulse: 76  Temp: 98.2 F (36.8 C)    BP Readings from Last 3 Encounters:  07/03/16 104/66  04/02/16 120/72  03/04/16 131/82   Wt Readings from Last 3 Encounters:  07/03/16 173 lb 3.2 oz (78.6 kg)  04/02/16 172 lb 6.4 oz (78.2 kg)  03/04/16 170 lb (77.1 kg)    Physical Exam  Constitutional: No distress.  HENT:  Head: Normocephalic and atraumatic.  No noticeable difference in the area of his parotid glands on palpation, there is mild decreased sensation on the left aspect anterior to his ear that is improved from previously  Cardiovascular: Normal rate, regular rhythm and normal heart sounds.   Pulmonary/Chest: Effort normal and breath sounds normal.  Neurological: He is alert. Gait normal.  Skin: Skin is warm and dry. He is not diaphoretic.  Scattered actinic keratoses on his arms, left anterior shoulder with small erythematous patch that is scaly and nontender, right mid medial aspect of calf with about a dime size area that is consistent with a scar     Assessment/Plan: Please  see individual problem list.  Overweight Weight is unchanged. Encourage diet and exercise. We'll continue to monitor.  Parotid mass Doing well status post removal of this. Sensation is continued to improve. He will continue to monitor.  Actinic keratoses Patient with apparent actinic keratoses on his arms bilaterally. Area on left anterior shoulder could be actinic keratoses. Area on right leg appears to be a scar. Discussed given widespread skin changes having him see a dermatologist. Referral was placed.   Orders Placed This Encounter  Procedures  . Ambulatory referral to Dermatology    Referral Priority:   Routine    Referral Type:   Consultation    Referral Reason:   Specialty Services Required    Requested Specialty:   Dermatology    Number of Visits Requested:   1    Tommi Rumps, MD [place]

## 2016-07-03 NOTE — Progress Notes (Signed)
Pre visit review using our clinic review tool, if applicable. No additional management support is needed unless otherwise documented below in the visit note. 

## 2016-07-03 NOTE — Assessment & Plan Note (Signed)
Patient with apparent actinic keratoses on his arms bilaterally. Area on left anterior shoulder could be actinic keratoses. Area on right leg appears to be a scar. Discussed given widespread skin changes having him see a dermatologist. Referral was placed.

## 2016-09-10 DIAGNOSIS — Z23 Encounter for immunization: Secondary | ICD-10-CM | POA: Diagnosis not present

## 2016-09-25 IMAGING — CT CT NECK W/ CM
2 of 3 series · 7 of 14 positions shown, 8 images · IV contrast (isovue)
Comparison: 11/09/2013

CLINICAL DATA: Left parotid mass with increasing growth.

EXAM:
CT NECK WITH CONTRAST
TECHNIQUE: Multidetector CT imaging of the neck was performed using the
standard protocol following the bolus administration of intravenous
contrast.
CONTRAST:  75 mL Isovue 300

[Series 2: axial neck · axial · 0.60mm/px · z∈[-236,-116]mm · 3 of 122 slices shown]
[im 31/122  bone]
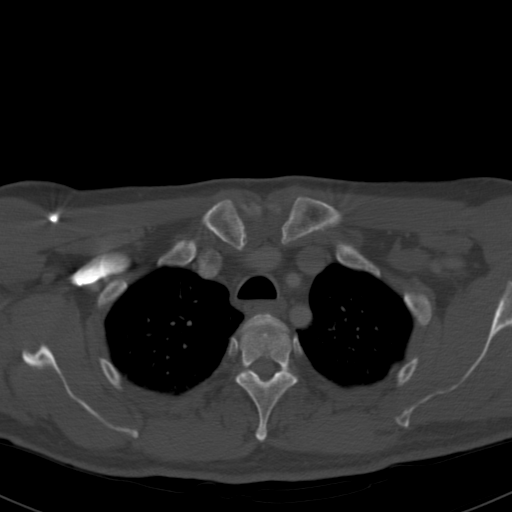
[im 61/122  bone]
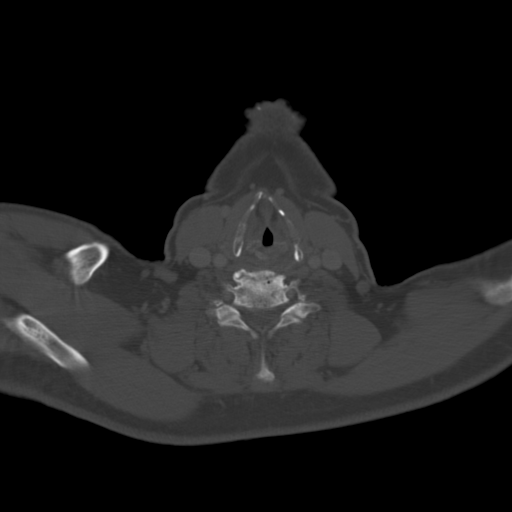
[im 91/122  bone]
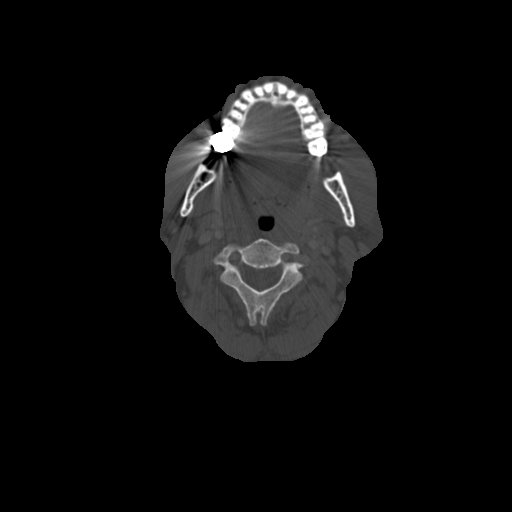

[Series 7: ax oropharynx · axial · 0.38mm/px · z∈[-284,-127]mm · 4 of 138 slices shown, 5 images]
[im 28/138  soft-tissue]
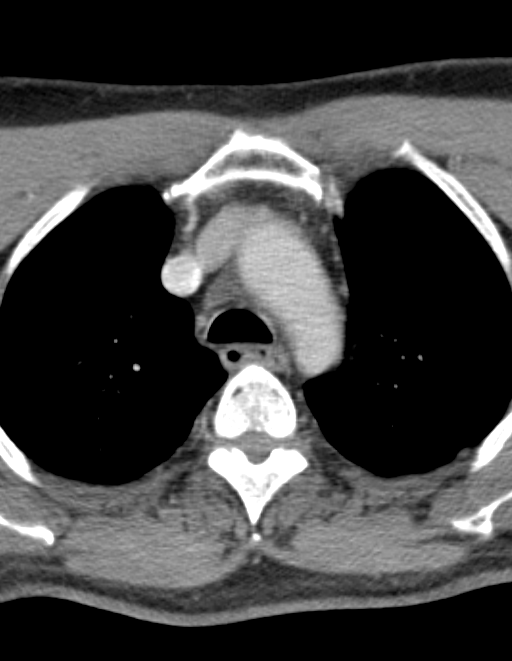
[im 28/138  bone]
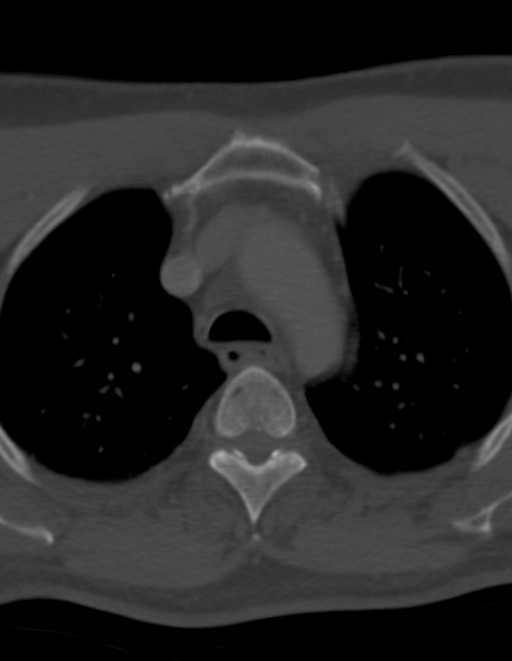
[im 55/138  bone]
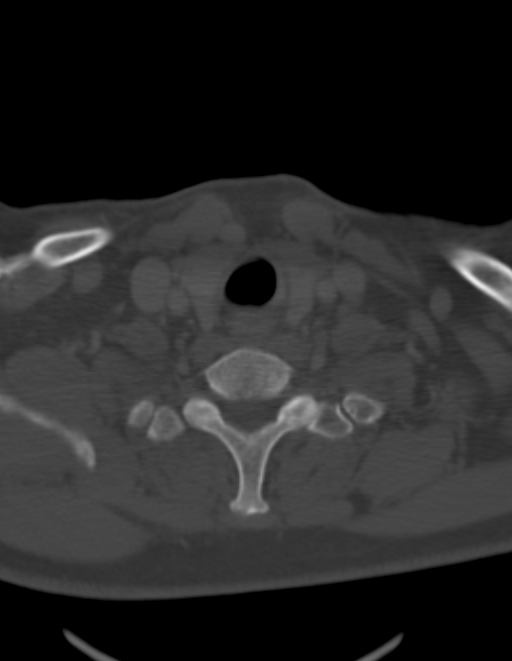
[im 83/138  bone]
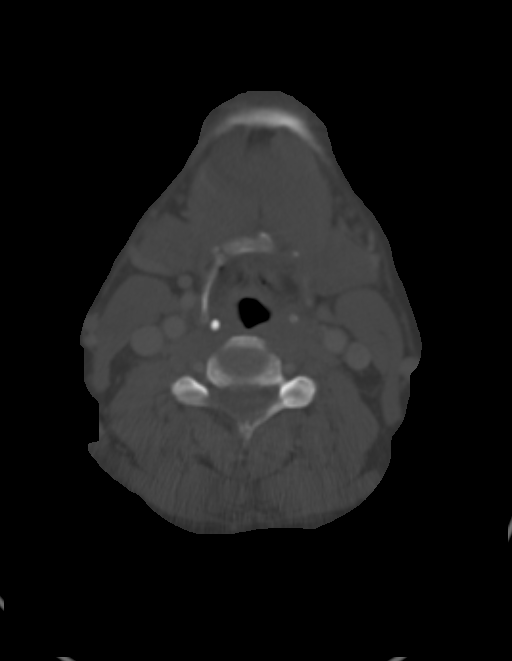
[im 110/138  bone]
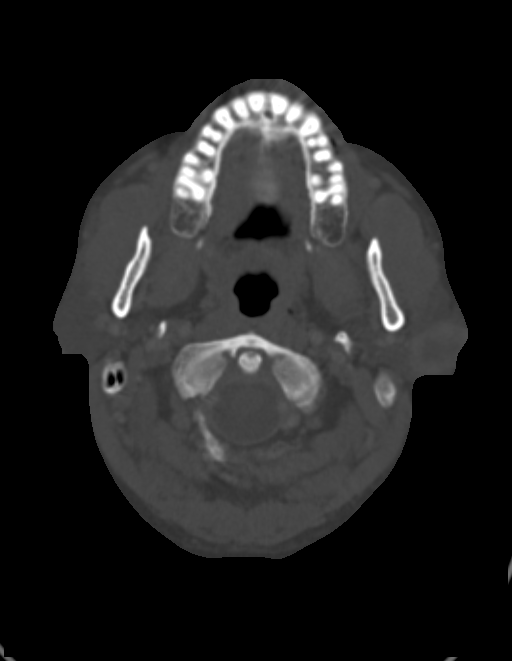

[7 of 14 positions shown; findings below may reference images not displayed]

FINDINGS: Pharynx and larynx: No focal mucosal or submucosal lesions are
present. The vocal cords are midline and symmetric. The tongue base
is unremarkable.

Salivary glands: There is interval growth of a peripherally
enhancing left parotid mass. The lesion now measures 2.7 x 2.3 x
cm. It previously measured 2.1 x 1.7 cm. No additional lesions are
evident. The right parotid gland is normal. The submandibular glands
are within normal limits bilaterally.

Thyroid: An 11 mm low-density nodule is present at the inferior
aspect of the left lobe of the thyroid. This demonstrates growth
since the prior study. A 4 mm lesion is present in the superior
aspect of the right lobe. The overall size of the thyroid is within
normal limits.

Lymph nodes: No significant cervical adenopathy is present.

Vascular: No significant carotid disease is present. The aortic arch
is within normal limits.

Limited intracranial: Within normal limits

Visualized orbits: Unremarkable

Mastoids and visualized paranasal sinuses: Clear

Skeleton: Multilevel degenerative changes are again noted in the
cervical spine. These are most pronounced at C5-6 and C6-7. There
straightening of the normal cervical lordosis. No focal lytic or
blastic lesions are present.

Upper chest: Mild dependent atelectasis is present at both lung
apices. The superior mediastinum is within normal limits.
IMPRESSION: 1. Enlarging peripherally enhancing mass lesion in the inferior
aspect of the left parotid gland, now measuring 2.7 x 2.3 x 2.5 cm.
This remains concerning for neoplasm, most likely Warthin's tumor or
pleomorphic adenoma.

## 2016-10-30 ENCOUNTER — Encounter: Payer: Self-pay | Admitting: Family Medicine

## 2016-10-30 ENCOUNTER — Ambulatory Visit (INDEPENDENT_AMBULATORY_CARE_PROVIDER_SITE_OTHER): Payer: PPO | Admitting: Family Medicine

## 2016-10-30 DIAGNOSIS — J069 Acute upper respiratory infection, unspecified: Secondary | ICD-10-CM | POA: Insufficient documentation

## 2016-10-30 DIAGNOSIS — B9789 Other viral agents as the cause of diseases classified elsewhere: Secondary | ICD-10-CM

## 2016-10-30 NOTE — Assessment & Plan Note (Signed)
Patient's symptoms and exam most consistent with viral upper respiratory infection. Suspect some level postnasal drip given erythematous throat and rhinorrhea. Suspect that is causing his cough. Has benign lung exam. Oxygen is stable. Blood pressure slightly elevated at this time. Discussed supportive care. He'll return in 1 week for blood pressure check and if still having symptoms at that time I will reevaluate. Patient is given return precautions.

## 2016-10-30 NOTE — Progress Notes (Signed)
  Tommi Rumps, MD Phone: [phone]  Henry Skinner is a 70 y.o. male who presents today for same-day visit.  Patient notes 3 days of cough. Nonproductive. Notes some mild rhinorrhea with it. No fevers, chills, sweats, sore throat, postnasal drip, wheezing, or shortness of breath. Has been using NyQuil and dextromethorphan with some good benefit. No sick contacts.  ROS see history of present illness  Objective  Physical Exam Vitals:   10/30/16 1057  BP: (!) 150/76  Pulse: 90  Temp: 98.6 F (37 C)    BP Readings from Last 3 Encounters:  10/30/16 (!) 150/76  07/03/16 104/66  04/02/16 120/72   Wt Readings from Last 3 Encounters:  10/30/16 172 lb (78 kg)  07/03/16 173 lb 3.2 oz (78.6 kg)  04/02/16 172 lb 6.4 oz (78.2 kg)    Physical Exam  Constitutional: No distress.  HENT:  Head: Normocephalic and atraumatic.  Mild posterior oropharyngeal erythema, normal TMs bilaterally  Eyes: Pupils are equal, round, and reactive to light.  Neck: Neck supple.  Cardiovascular: Normal rate, regular rhythm and normal heart sounds.   Pulmonary/Chest: Effort normal and breath sounds normal.  Musculoskeletal: He exhibits no edema.  Lymphadenopathy:    He has no cervical adenopathy.  Neurological: He is alert. Gait normal.  Skin: He is not diaphoretic.     Assessment/Plan: Please see individual problem list.  Viral upper respiratory infection Patient's symptoms and exam most consistent with viral upper respiratory infection. Suspect some level postnasal drip given erythematous throat and rhinorrhea. Suspect that is causing his cough. Has benign lung exam. Oxygen is stable. Blood pressure slightly elevated at this time. Discussed supportive care. He'll return in 1 week for blood pressure check and if still having symptoms at that time I will reevaluate. Patient is given return precautions.   Tommi Rumps, MD [place]

## 2016-10-30 NOTE — Patient Instructions (Signed)
Nice to see you. Your symptoms are likely related to a virus. You can continue over-the-counter NyQuil. You can add Claritin and Flonase as well if desired. You need to stay well hydrated. You should start to feel better in the next week. If you do not please let us know. If you develop shortness of breath, cough or blood, or fevers, or any new or changing symptoms please seek medical attention immediately.

## 2016-11-06 ENCOUNTER — Ambulatory Visit (INDEPENDENT_AMBULATORY_CARE_PROVIDER_SITE_OTHER): Payer: PPO

## 2016-11-06 VITALS — BP 148/86 | HR 81 | Resp 18

## 2016-11-06 DIAGNOSIS — R030 Elevated blood-pressure reading, without diagnosis of hypertension: Secondary | ICD-10-CM

## 2016-11-06 NOTE — Progress Notes (Signed)
Patient comes in 1 week blood pressure check.  He states when he gave donated blood , they checked blood pressure and it was 144/78,   He only reports taking Mucinex.   Please advise.

## 2016-11-11 NOTE — Progress Notes (Signed)
BP is above goal. I would like to add a BP medication such as amlodipine. Please see if the patient is willing to do this. Thanks.

## 2016-11-12 NOTE — Progress Notes (Signed)
Patient advised of below.  Patient states he has been taking Theraflu still coughing  .  He declined to start medication for blood pressure .  He will get back in touch with our office .

## 2016-11-14 DIAGNOSIS — D485 Neoplasm of uncertain behavior of skin: Secondary | ICD-10-CM | POA: Diagnosis not present

## 2016-11-14 DIAGNOSIS — L821 Other seborrheic keratosis: Secondary | ICD-10-CM | POA: Diagnosis not present

## 2016-11-14 DIAGNOSIS — C44619 Basal cell carcinoma of skin of left upper limb, including shoulder: Secondary | ICD-10-CM | POA: Diagnosis not present

## 2016-11-14 DIAGNOSIS — C44629 Squamous cell carcinoma of skin of left upper limb, including shoulder: Secondary | ICD-10-CM | POA: Diagnosis not present

## 2016-11-14 DIAGNOSIS — C44519 Basal cell carcinoma of skin of other part of trunk: Secondary | ICD-10-CM | POA: Diagnosis not present

## 2016-12-08 DIAGNOSIS — C44619 Basal cell carcinoma of skin of left upper limb, including shoulder: Secondary | ICD-10-CM | POA: Diagnosis not present

## 2016-12-08 DIAGNOSIS — L905 Scar conditions and fibrosis of skin: Secondary | ICD-10-CM | POA: Diagnosis not present

## 2016-12-09 DIAGNOSIS — Z85828 Personal history of other malignant neoplasm of skin: Secondary | ICD-10-CM | POA: Diagnosis not present

## 2016-12-09 DIAGNOSIS — C44629 Squamous cell carcinoma of skin of left upper limb, including shoulder: Secondary | ICD-10-CM | POA: Diagnosis not present

## 2016-12-19 ENCOUNTER — Telehealth: Payer: Self-pay | Admitting: Family Medicine

## 2016-12-19 NOTE — Telephone Encounter (Signed)
Pt came in today. He want to get a PNA vac. I will schedule appt, please have Dr. Caryl Bis put the order in for the PNA vac.   Pt states he had the flu shot at [facility] on [address].

## 2016-12-19 NOTE — Telephone Encounter (Signed)
noted 

## 2016-12-19 NOTE — Telephone Encounter (Signed)
I do not believe I have to place an order for the pneumonia shot. The patient is good to have the Prevnar 13. Thanks.

## 2016-12-19 NOTE — Telephone Encounter (Signed)
Please put order in

## 2016-12-22 DIAGNOSIS — L905 Scar conditions and fibrosis of skin: Secondary | ICD-10-CM | POA: Diagnosis not present

## 2016-12-22 DIAGNOSIS — C44519 Basal cell carcinoma of skin of other part of trunk: Secondary | ICD-10-CM | POA: Diagnosis not present

## 2016-12-25 ENCOUNTER — Ambulatory Visit (INDEPENDENT_AMBULATORY_CARE_PROVIDER_SITE_OTHER): Payer: PPO

## 2016-12-25 DIAGNOSIS — Z23 Encounter for immunization: Secondary | ICD-10-CM

## 2016-12-25 NOTE — Progress Notes (Signed)
Pt was given prevnar today in the Left deltoid. Pt tolerated well. See telephone encounter on 12/19/16 PCP asked for pt to have this vaccine.

## 2016-12-26 NOTE — Progress Notes (Signed)
I have reviewed the above note and agree.  Rhiannan Kievit, M.D.  

## 2017-01-06 ENCOUNTER — Encounter: Payer: Self-pay | Admitting: Family Medicine

## 2017-01-06 ENCOUNTER — Ambulatory Visit (INDEPENDENT_AMBULATORY_CARE_PROVIDER_SITE_OTHER): Payer: PPO | Admitting: Family Medicine

## 2017-01-06 VITALS — BP 170/90 | HR 66 | Temp 97.9°F | Wt 169.6 lb

## 2017-01-06 DIAGNOSIS — C4490 Unspecified malignant neoplasm of skin, unspecified: Secondary | ICD-10-CM | POA: Insufficient documentation

## 2017-01-06 DIAGNOSIS — I10 Essential (primary) hypertension: Secondary | ICD-10-CM | POA: Diagnosis not present

## 2017-01-06 LAB — COMPREHENSIVE METABOLIC PANEL
ALT: 29 U/L (ref 0–53)
AST: 21 U/L (ref 0–37)
Albumin: 4.5 g/dL (ref 3.5–5.2)
Alkaline Phosphatase: 75 U/L (ref 39–117)
BILIRUBIN TOTAL: 1.1 mg/dL (ref 0.2–1.2)
BUN: 14 mg/dL (ref 6–23)
CALCIUM: 9.7 mg/dL (ref 8.4–10.5)
CHLORIDE: 103 meq/L (ref 96–112)
CO2: 30 meq/L (ref 19–32)
CREATININE: 1.03 mg/dL (ref 0.40–1.50)
GFR: 76.17 mL/min (ref >60.00)
Glucose, Bld: 101 mg/dL — ABNORMAL HIGH (ref 70–99)
Potassium: 4 mEq/L (ref 3.5–5.1)
SODIUM: 138 meq/L (ref 135–145)
Total Protein: 8.1 g/dL (ref 6.0–8.3)

## 2017-01-06 NOTE — Assessment & Plan Note (Signed)
Skin cancer and several biopsies through dermatology. He will continue to follow-up with them. Biopsy sites appear to be well healing.

## 2017-01-06 NOTE — Patient Instructions (Signed)
Nice to see you. You need to start checking your blood pressure at home. Your goal is less than 140/90. If it is consistently greater than please let us know. We'll see you back in a month for blood pressure check.

## 2017-01-06 NOTE — Progress Notes (Signed)
  Tommi Rumps, MD Phone: [phone]  Henry Skinner is a 71 y.o. male who presents today for follow-up.  Hypertension: Patient's blood pressure has been noted to be elevated on the last several occasions. Had previously been normal. No chest pain or shortness of breath. No edema. He has had a lot going on at home and has not been able to exercise. He does not want to start on any medicine.  Patient initially reports several skin cancers removed recently. One from his left hand one from his left anterior shoulder one from his left scapula. He is following with dermatology for this.  ROS see history of present illness  Objective  Physical Exam Vitals:   01/06/17 1003  BP: (!) 170/90  Pulse: 66  Temp: 97.9 F (36.6 C)    BP Readings from Last 3 Encounters:  01/06/17 (!) 170/90  11/06/16 (!) 148/86  10/30/16 (!) 150/76   Wt Readings from Last 3 Encounters:  01/06/17 169 lb 9.6 oz (76.9 kg)  10/30/16 172 lb (78 kg)  07/03/16 173 lb 3.2 oz (78.6 kg)    Physical Exam  Constitutional: No distress.  Cardiovascular: Normal rate, regular rhythm and normal heart sounds.   Pulmonary/Chest: Effort normal and breath sounds normal.  Musculoskeletal: He exhibits no edema.  Neurological: He is alert. Gait normal.  Skin: He is not diaphoretic.  Well healing scars on left hand dorsal aspect, left anterior shoulder, and left scapula, no signs of infection     Assessment/Plan: Please see individual problem list.  Essential hypertension Patient with blood pressures elevated on several occasions. Advised of diagnosis of hypertension. Discussed starting on medication for this. Patient is very hesitant to start any medication. Advised that if he did not want to start medication he needs to check his blood pressure at home and see what it is running. If it is consistently greater than 140/90 he will contact us. Advised follow-up in a couple of weeks for recheck of blood pressure though he  declined this and wanted to follow-up in one month.  Skin cancer Skin cancer and several biopsies through dermatology. He will continue to follow-up with them. Biopsy sites appear to be well healing.   Orders Placed This Encounter  Procedures  . Comp Met (CMET)    Tommi Rumps, MD [place]

## 2017-01-06 NOTE — Assessment & Plan Note (Signed)
Patient with blood pressures elevated on several occasions. Advised of diagnosis of hypertension. Discussed starting on medication for this. Patient is very hesitant to start any medication. Advised that if he did not want to start medication he needs to check his blood pressure at home and see what it is running. If it is consistently greater than 140/90 he will contact us. Advised follow-up in a couple of weeks for recheck of blood pressure though he declined this and wanted to follow-up in one month.

## 2017-01-06 NOTE — Progress Notes (Signed)
Pre visit review using our clinic review tool, if applicable. No additional management support is needed unless otherwise documented below in the visit note. 

## 2017-01-26 ENCOUNTER — Telehealth: Payer: Self-pay | Admitting: Family Medicine

## 2017-01-26 NOTE — Telephone Encounter (Signed)
Called pt. VM full. Please schedule or call Ebony Hail back directly at [phone] to schedule AWV. Thanks!

## 2017-02-04 ENCOUNTER — Ambulatory Visit: Payer: PPO | Admitting: Family Medicine

## 2017-02-24 ENCOUNTER — Encounter: Payer: Self-pay | Admitting: Family Medicine

## 2017-02-24 ENCOUNTER — Ambulatory Visit (INDEPENDENT_AMBULATORY_CARE_PROVIDER_SITE_OTHER): Payer: PPO | Admitting: Family Medicine

## 2017-02-24 VITALS — BP 168/90 | HR 79 | Temp 98.3°F | Wt 172.2 lb

## 2017-02-24 DIAGNOSIS — I10 Essential (primary) hypertension: Secondary | ICD-10-CM | POA: Diagnosis not present

## 2017-02-24 DIAGNOSIS — Z1211 Encounter for screening for malignant neoplasm of colon: Secondary | ICD-10-CM

## 2017-02-24 DIAGNOSIS — E663 Overweight: Secondary | ICD-10-CM | POA: Diagnosis not present

## 2017-02-24 MED ORDER — AMLODIPINE BESYLATE 5 MG PO TABS: 5.0000 mg | ORAL_TABLET | Freq: Every day | ORAL | 3 refills | Status: DC

## 2017-02-24 NOTE — Patient Instructions (Signed)
Nice to see. We'll start you on amlodipine. We will have you return for fasting lab work and a nurse visit in about a week.

## 2017-02-24 NOTE — Progress Notes (Signed)
  Tommi Rumps, MD Phone: [phone]  Henry Skinner is a 71 y.o. male who presents today for f/u.  HYPERTENSION  Disease Monitoring  Home BP Monitoring not checking Chest pain- no    Dyspnea- no Edema- no Exercises by walking 3 miles daily 4-5 days a week. He notes no headaches. No vision changes. He feels well overall. No history of hyperlipidemia. No history of diabetes.  PMH: nonsmoker.   ROS see history of present illness  Objective  Physical Exam Vitals:   02/24/17 1456 02/24/17 1522  BP: (!) 180/94 (!) 168/90  Pulse: 79   Temp: 98.3 F (36.8 C)     BP Readings from Last 3 Encounters:  02/24/17 (!) 168/90  01/06/17 (!) 170/90  11/06/16 (!) 148/86   Wt Readings from Last 3 Encounters:  02/24/17 172 lb 3.2 oz (78.1 kg)  01/06/17 169 lb 9.6 oz (76.9 kg)  10/30/16 172 lb (78 kg)    Physical Exam  Constitutional: No distress.  Cardiovascular: Normal rate, regular rhythm and normal heart sounds.   Pulmonary/Chest: Effort normal and breath sounds normal.  Musculoskeletal: He exhibits no edema.  Neurological: He is alert. Gait normal.  Skin: Skin is warm and dry. He is not diaphoretic.     Assessment/Plan: Please see individual problem list.  Essential hypertension Not at goal. He is ready to start on medication. We'll send in amlodipine. We'll have him return for fasting lab work. He'll return in 1-2 weeks for recheck her blood pressure with nursing.   Orders Placed This Encounter  Procedures  . Comp Met (CMET)    Standing Status:   Future    Standing Expiration Date:   02/24/2018  . Lipid Profile    Standing Status:   Future    Standing Expiration Date:   02/24/2018  . HgB A1c    Standing Status:   Future    Standing Expiration Date:   02/24/2018    Meds ordered this encounter  Medications  . amLODipine (NORVASC) 5 MG tablet    Sig: Take 1 tablet (5 mg total) by mouth daily.    Dispense:  90 tablet    Refill:  [place],  MD [place]

## 2017-02-24 NOTE — Progress Notes (Signed)
Pre visit review using our clinic review tool, if applicable. No additional management support is needed unless otherwise documented below in the visit note. 

## 2017-02-24 NOTE — Assessment & Plan Note (Signed)
Not at goal. He is ready to start on medication. We'll send in amlodipine. We'll have him return for fasting lab work. He'll return in 1-2 weeks for recheck her blood pressure with nursing.

## 2017-02-27 ENCOUNTER — Other Ambulatory Visit: Payer: PPO

## 2017-03-02 ENCOUNTER — Other Ambulatory Visit (INDEPENDENT_AMBULATORY_CARE_PROVIDER_SITE_OTHER): Payer: PPO

## 2017-03-02 DIAGNOSIS — K921 Melena: Secondary | ICD-10-CM

## 2017-03-02 LAB — FECAL OCCULT BLOOD, IMMUNOCHEMICAL: Fecal Occult Bld: NEGATIVE

## 2017-03-04 ENCOUNTER — Ambulatory Visit (INDEPENDENT_AMBULATORY_CARE_PROVIDER_SITE_OTHER): Payer: PPO | Admitting: *Deleted

## 2017-03-04 ENCOUNTER — Encounter: Payer: Self-pay | Admitting: *Deleted

## 2017-03-04 VITALS — BP 134/74 | HR 78 | Resp 14

## 2017-03-04 DIAGNOSIS — I10 Essential (primary) hypertension: Secondary | ICD-10-CM | POA: Diagnosis not present

## 2017-03-04 NOTE — Progress Notes (Signed)
Patient presented for BP check one week post starting Amlodipine 5 mg daily for BP attained in office on 02/24/17  Of 180/94 . Patient BP attained today according to nursing standards, in left arm BP 138/74 pulse 77 after the appropriate wait time BP in right arm was attained BP 134/74 pulse 78.  Patient was very nice , and wanted PCP to know he is changing some of his life style also to reduce pressures.  Such as eating more vegetables , drinking more water , and exercising walking 3 miles per day at 3.4 miles per hour.

## 2017-03-05 NOTE — Progress Notes (Signed)
Patient mailbox is full and cannot except messages will continue to call.

## 2017-03-05 NOTE — Progress Notes (Signed)
BP much better controlled. He should continue the amlodipine. He should follow up in 3 months.  Tommi Rumps, M.D.

## 2017-03-09 NOTE — Progress Notes (Signed)
Mail box is full on patient voice mail second attempt to call.

## 2017-03-13 NOTE — Telephone Encounter (Signed)
Called pt. 2nd attempt. VM full. Please schedule or call Ebony Hail back directly at [phone] to schedule AWV. Thanks!

## 2017-03-17 ENCOUNTER — Encounter: Payer: Self-pay | Admitting: *Deleted

## 2017-03-17 NOTE — Progress Notes (Signed)
Letter mailed to patient after multiple attempts to reach by phone.

## 2017-03-18 ENCOUNTER — Ambulatory Visit (INDEPENDENT_AMBULATORY_CARE_PROVIDER_SITE_OTHER): Payer: PPO

## 2017-03-18 VITALS — BP 138/78 | HR 73 | Temp 98.3°F | Resp 12 | Ht 67.5 in | Wt 163.8 lb

## 2017-03-18 DIAGNOSIS — Z0000 Encounter for general adult medical examination without abnormal findings: Secondary | ICD-10-CM

## 2017-03-18 NOTE — Patient Instructions (Addendum)
  Henry Skinner , Thank you for taking time to come for your Medicare Wellness Visit. I appreciate your ongoing commitment to your health goals. Please review the following plan we discussed and let me know if I can assist you in the future.   Follow up with Dr. Caryl Bis as needed.    Bring a copy of your [place] and/or Living Will to be scanned into chart once completed.  Have a great day!  These are the goals we discussed: Goals    . Healthy Lifestyle          Stay active Low carb foods Stay hydrated       This is a list of the screening recommended for you and due dates:  Health Maintenance  Topic Date Due  . Colon Cancer Screening  04/16/2018*  . Flu Shot  06/17/2017  . Pneumonia vaccines (2 of 2 - PPSV23) 12/25/2017  . Tetanus Vaccine  01/07/2026  .  Hepatitis C: One time screening is recommended by Center for Disease Control  (CDC) for  adults born from [birth year] through 1963.   Completed  *Topic was postponed. The date shown is not the original due date.

## 2017-03-18 NOTE — Progress Notes (Signed)
Subjective:   Henry Skinner is a 71 y.o. male who presents for an Initial Medicare Annual Wellness Visit.  Review of Systems  No ROS.  Medicare Wellness Visit.  Cardiac Risk Factors include: advanced age (>63men, >70 women);hypertension;male gender    Objective:    Today's Vitals   03/18/17 1437  BP: 138/78  Pulse: 73  Resp: 12  Temp: 98.3 F (36.8 C)  TempSrc: Oral  SpO2: 98%  Weight: 163 lb 12.8 oz (74.3 kg)  Height: 5' 7.5" (1.715 m)   Body mass index is 25.28 kg/m.  Current Medications (verified) Outpatient Encounter Prescriptions as of 03/18/2017  Medication Sig  . amLODipine (NORVASC) 5 MG tablet Take 1 tablet (5 mg total) by mouth daily.  . Multiple Vitamin (MULTIVITAMIN) tablet Take 1 tablet by mouth daily.  Marland Kitchen VITAMIN D, CHOLECALCIFEROL, PO Take 2,000 Units by mouth.   No facility-administered encounter medications on file as of 03/18/2017.     Allergies (verified) Patient has no known allergies.   History: Past Medical History:  Diagnosis Date  . Chickenpox   . Kidney stones   . Skin irritation    Past Surgical History:  Procedure Laterality Date  . PAROTIDECTOMY Left 03/04/2016   Procedure: PAROTIDECTOMY;  Surgeon: Beverly Gust, MD;  Location: [facility] ORS;  Service: ENT;  Laterality: Left;  . ROTATOR CUFF REPAIR  May 2016  . SKIN SURGERY     L hand, L arm/shoulder    Family History  Problem Relation Age of Onset  . Alcoholism Brother   . Colon cancer Maternal Uncle    Social History   Occupational History  . Not on file.   Social History Main Topics  . Smoking status: Never Smoker  . Smokeless tobacco: Never Used  . Alcohol use 0.6 oz/week    1 Standard drinks or equivalent per week     Comment: 1 beer a day   . Drug use: Yes    Types: Marijuana     Comment: marijuana, rarely  . Sexual activity: Yes   Tobacco Counseling Counseling given: Not Answered   Activities of Daily Living In your present state of health, do you have any  difficulty performing the following activities: 03/18/2017  Hearing? N  Vision? N  Difficulty concentrating or making decisions? N  Walking or climbing stairs? N  Dressing or bathing? N  Doing errands, shopping? N  Preparing Food and eating ? N  Using the Toilet? N  In the past six months, have you accidently leaked urine? N  Do you have problems with loss of bowel control? N  Managing your Medications? N  Managing your Finances? N  Housekeeping or managing your Housekeeping? N  Some recent data might be hidden    Immunizations and Health Maintenance Immunization History  Administered Date(s) Administered  . Influenza-Unspecified 10/20/2016  . Pneumococcal Conjugate-13 12/25/2016  . Tdap 01/08/2016  . Zoster 12/18/2006   There are no preventive care reminders to display for this patient.  Patient Care Team: Leone Haven, MD as PCP - General (Family Medicine)  Indicate any recent Medical Services you may have received from other than [facility] providers in the past year (date may be approximate).    Assessment:   This is a routine wellness examination for Henry Skinner. The goal of the wellness visit is to assist the patient how to close the gaps in care and create a preventative care plan for the patient.   Taking calcium VIT D as appropriate/Osteoporosis risk reviewed.  Medications reviewed; taking without issues or barriers.  Safety issues reviewed; smoke detectors in the home. Firearms locked up in the home. Wears seatbelts when driving or riding with others. Patient does wear sunscreen or protective clothing when in direct sunlight. No violence in the home.  Depression- PHQ 2 &9 complete.  No signs/symptoms or verbal communication regarding little pleasure in doing things, feeling down, depressed or hopeless. No changes in sleeping, energy, eating, concentrating.  No thoughts of self harm or harm towards others.    Patient is alert, normal appearance, oriented to  person/place/and time. Correctly identified the president of the [place], recall of 3/3 words, and performing simple calculations.  Patient displays appropriate judgement and can read correct time from watch face.  No new identified risk were noted.  No failures at ADL's or IADL's.   BMI- discussed the importance of a healthy diet, water intake and exercise. Educational material provided.   24 hour diet recall: Breakfast: Cereal, fruit Lunch: Salad Dinner: Fish, , bread Snack: Crackers, chocolate Daily fluid intake: 2 cups of caffeine, 8-10 cups of water  HTN- followed by PCP.  Dental- he cannot recall the last dental exam.  Encouraged to make an appointment with dentist.    Sleep patterns- Sleeps 5-6 hours at night.  Wakes feeling rested.  Naps an additional 45 minutes during the day.  Colonoscopy/Cologuard discussed, declined.  Educational material provided.   Hemoccult ICT completed within the last 90 days.  PCP aware.  Patient Concerns: None at this time. Follow up with PCP as needed.  Hearing/Vision screen Hearing Screening Comments: Patient is able to hear conversational tones without difficulty.  No issues reported.   Vision Screening Comments: Visual acuity not assessed per patient preference.   He cannot recall his last eye exam. Encouraged to schedule an eye exam with ophthalmologist.   Dietary issues and exercise activities discussed: Current Exercise Habits: Home exercise routine, Type of exercise: walking, Time (Minutes): 50, Frequency (Times/Week): 5, Weekly Exercise (Minutes/Week): 250, Intensity: Intense  Goals    . Healthy Lifestyle          Stay active Low carb foods Stay hydrated      Depression Screen PHQ 2/9 Scores 03/18/2017 10/30/2016  PHQ - 2 Score 0 0  PHQ- 9 Score 0 -    Fall Risk Fall Risk  10/30/2016  Falls in the past year? No    Cognitive Function: MMSE - Mini Mental State Exam 03/18/2017  Orientation to time 5  Orientation to Place 5   Registration 3  Attention/ Calculation 5  Recall 3  Language- name 2 objects 2  Language- repeat 1  Language- follow 3 step command 3  Language- read & follow direction 1  Write a sentence 1  Copy design 1  Total score 30        Screening Tests Health Maintenance  Topic Date Due  . COLONOSCOPY  04/16/2018 (Originally 04/24/1998)  . INFLUENZA VACCINE  06/17/2017  . PNA vac Low Risk Adult (2 of 2 - PPSV23) 12/25/2017  . TETANUS/TDAP  01/07/2026  . Hepatitis C Screening  Completed        Plan:   End of life planning; Advanced aging; Advanced directives discussed.  No HCPOA/Living Will.  Additional information provided to help them start the conversation with family.  Copy of HCPOA/Living Will requested upon completion. Time spent on this topic is 27 minutes.  I have personally reviewed and noted the following in the patient's chart:   . Medical  and social history . Use of alcohol, tobacco or illicit drugs  . Current medications and supplements . Functional ability and status . Nutritional status . Physical activity . Advanced directives . List of other physicians . Hospitalizations, surgeries, and ER visits in previous 12 months . Vitals . Screenings to include cognitive, depression, and falls . Referrals and appointments  In addition, I have reviewed and discussed with patient certain preventive protocols, quality metrics, and best practice recommendations. A written personalized care plan for preventive services as well as general preventive health recommendations were provided to patient.     Varney Biles, LPN   0/06/8109

## 2017-03-18 NOTE — Progress Notes (Signed)
I have reviewed the above note and agree.  Eric Sonnenberg, M.D.  

## 2017-03-24 ENCOUNTER — Telehealth: Payer: Self-pay

## 2017-03-24 NOTE — Telephone Encounter (Addendum)
Reason for call patient walked in right arm tick bite,  Symptoms:tick bite right arm, itching ,there is area where tick was removed that is raised area,  cleaned area with alcohol , used skin so soft on area to prevent tick bites,  No fever body aches, Patient states he had several tick bites already this year.   Duration Monday  Medications: Last seen for this problem: Seen by: Saved tick,  Patient scheduled for appointment tomorrow.

## 2017-03-25 ENCOUNTER — Ambulatory Visit (INDEPENDENT_AMBULATORY_CARE_PROVIDER_SITE_OTHER): Payer: PPO | Admitting: Family Medicine

## 2017-03-25 ENCOUNTER — Encounter: Payer: Self-pay | Admitting: Family Medicine

## 2017-03-25 DIAGNOSIS — S40861A Insect bite (nonvenomous) of right upper arm, initial encounter: Secondary | ICD-10-CM | POA: Insufficient documentation

## 2017-03-25 DIAGNOSIS — W57XXXA Bitten or stung by nonvenomous insect and other nonvenomous arthropods, initial encounter: Secondary | ICD-10-CM

## 2017-03-25 NOTE — Patient Instructions (Signed)
Call with concerns/issues.  Take care  Dr. Lacinda Axon

## 2017-03-25 NOTE — Assessment & Plan Note (Signed)
New acute problem. Patient with no signs or symptoms of tickborne illness at this time. Supportive care.

## 2017-03-25 NOTE — Progress Notes (Signed)
   Subjective:  Patient ID: Henry Skinner, male    DOB: [birth year]  Age: 71 y.o. MRN: [id number]  CC: Tick bite  HPI:  71 year old male presents with the above complaint.  Patient states that he was working in the yard earlier this week. He noticed noticed an area in his right upper arm (tricep region). He subsequently discovered that it was a tick bite. He removed the tick without difficulty. He presents today for evaluation regarding this. He states he feels well. He's had a localized reaction at the site of the tick bite. Otherwise he has no symptoms. No myalgias, arthralgias, fever, chills, rash, headache. He is concerned about the development of Lyme disease. No other complaints or concerns at this time.  Social Hx   Social History   Social History  . Marital status: Single    Spouse name: N/A  . Number of children: N/A  . Years of education: N/A   Social History Main Topics  . Smoking status: Never Smoker  . Smokeless tobacco: Never Used  . Alcohol use 0.6 oz/week    1 Standard drinks or equivalent per week     Comment: 1 beer a day   . Drug use: Yes    Types: Marijuana     Comment: marijuana, rarely  . Sexual activity: Yes   Other Topics Concern  . None   Social History Narrative  . None    Review of Systems  Constitutional: Negative.   Musculoskeletal: Negative.   Skin:       Tick bite.   Neurological: Negative.    Objective:  BP 138/76 (BP Location: Left Arm, Patient Position: Sitting, Cuff Size: Normal)   Pulse 79   Temp 97.8 F (36.6 C) (Oral)   Resp 14   Wt 169 lb (76.7 kg)   SpO2 98%   BMI 26.08 kg/m   BP/Weight 03/25/2017 03/18/2017 01/08/9797  Systolic BP 921 194 174  Diastolic BP 76 78 74  Wt. (Lbs) 169 163.8 -  BMI 26.08 25.28 -    Physical Exam  Constitutional: He is oriented to person, place, and time. He appears well-developed. No distress.  Pulmonary/Chest: Effort normal.  Neurological: He is alert and oriented to person, place, and  time.  Skin:  Mild erythema and a single vesicle noted in the region of his tick bite (triceps, right).  Psychiatric: He has a normal mood and affect.  Vitals reviewed.   Lab Results  Component Value Date   GLUCOSE 101 (H) 01/06/2017   CHOL 166 01/17/2016   TRIG 78.0 01/17/2016   HDL 51.50 01/17/2016   LDLCALC 99 01/17/2016   ALT 29 01/06/2017   AST 21 01/06/2017   NA 138 01/06/2017   K 4.0 01/06/2017   CL 103 01/06/2017   CREATININE 1.03 01/06/2017   BUN 14 01/06/2017   CO2 30 01/06/2017    Assessment & Plan:   Problem List Items Addressed This Visit    Tick bite of right upper arm    New acute problem. Patient with no signs or symptoms of tickborne illness at this time. Supportive care.        Follow-up: PRN  [place]

## 2017-04-21 DIAGNOSIS — C44219 Basal cell carcinoma of skin of left ear and external auricular canal: Secondary | ICD-10-CM | POA: Diagnosis not present

## 2017-04-21 DIAGNOSIS — L821 Other seborrheic keratosis: Secondary | ICD-10-CM | POA: Diagnosis not present

## 2017-04-21 DIAGNOSIS — L570 Actinic keratosis: Secondary | ICD-10-CM | POA: Diagnosis not present

## 2017-04-21 DIAGNOSIS — D485 Neoplasm of uncertain behavior of skin: Secondary | ICD-10-CM | POA: Diagnosis not present

## 2017-04-21 DIAGNOSIS — C44619 Basal cell carcinoma of skin of left upper limb, including shoulder: Secondary | ICD-10-CM | POA: Diagnosis not present

## 2017-05-25 DIAGNOSIS — C44219 Basal cell carcinoma of skin of left ear and external auricular canal: Secondary | ICD-10-CM | POA: Diagnosis not present

## 2017-05-25 DIAGNOSIS — Z85828 Personal history of other malignant neoplasm of skin: Secondary | ICD-10-CM | POA: Diagnosis not present

## 2017-06-01 DIAGNOSIS — C44619 Basal cell carcinoma of skin of left upper limb, including shoulder: Secondary | ICD-10-CM | POA: Diagnosis not present

## 2017-06-01 DIAGNOSIS — L905 Scar conditions and fibrosis of skin: Secondary | ICD-10-CM | POA: Diagnosis not present

## 2017-09-04 DIAGNOSIS — C44612 Basal cell carcinoma of skin of right upper limb, including shoulder: Secondary | ICD-10-CM | POA: Diagnosis not present

## 2017-09-04 DIAGNOSIS — X32XXXA Exposure to sunlight, initial encounter: Secondary | ICD-10-CM | POA: Diagnosis not present

## 2017-09-04 DIAGNOSIS — Z08 Encounter for follow-up examination after completed treatment for malignant neoplasm: Secondary | ICD-10-CM | POA: Diagnosis not present

## 2017-09-04 DIAGNOSIS — L820 Inflamed seborrheic keratosis: Secondary | ICD-10-CM | POA: Diagnosis not present

## 2017-09-04 DIAGNOSIS — L570 Actinic keratosis: Secondary | ICD-10-CM | POA: Diagnosis not present

## 2017-09-04 DIAGNOSIS — L538 Other specified erythematous conditions: Secondary | ICD-10-CM | POA: Diagnosis not present

## 2017-09-04 DIAGNOSIS — L821 Other seborrheic keratosis: Secondary | ICD-10-CM | POA: Diagnosis not present

## 2017-09-04 DIAGNOSIS — Z85828 Personal history of other malignant neoplasm of skin: Secondary | ICD-10-CM | POA: Diagnosis not present

## 2017-09-04 DIAGNOSIS — D485 Neoplasm of uncertain behavior of skin: Secondary | ICD-10-CM | POA: Diagnosis not present

## 2017-09-04 DIAGNOSIS — R208 Other disturbances of skin sensation: Secondary | ICD-10-CM | POA: Diagnosis not present

## 2017-09-24 DIAGNOSIS — C44612 Basal cell carcinoma of skin of right upper limb, including shoulder: Secondary | ICD-10-CM | POA: Diagnosis not present

## 2017-12-09 ENCOUNTER — Telehealth: Payer: Self-pay

## 2017-12-09 NOTE — Telephone Encounter (Signed)
Copied from [place]. Topic: General - Call Back - No Documentation >> Dec 09, 2017 12:08 PM Aurelio Brash B wrote: Reason for CRM: pt returned call  no crm

## 2017-12-09 NOTE — Telephone Encounter (Signed)
Unknown who called patient, no documentation

## 2017-12-21 DIAGNOSIS — L309 Dermatitis, unspecified: Secondary | ICD-10-CM | POA: Diagnosis not present

## 2017-12-24 ENCOUNTER — Ambulatory Visit (INDEPENDENT_AMBULATORY_CARE_PROVIDER_SITE_OTHER): Payer: PPO | Admitting: Family Medicine

## 2017-12-24 ENCOUNTER — Encounter: Payer: Self-pay | Admitting: Family Medicine

## 2017-12-24 VITALS — BP 150/82 | HR 76 | Temp 98.7°F | Ht 67.5 in | Wt 175.0 lb

## 2017-12-24 DIAGNOSIS — I10 Essential (primary) hypertension: Secondary | ICD-10-CM | POA: Diagnosis not present

## 2017-12-24 NOTE — Patient Instructions (Signed)
Please monitor blood pressure daily, document readings, and bring your blood pressure readings and cuff in 2 to 3 weeks for follow up.  One option is an Omron cuff that can be purchased however it should be an arm cuff and other brands can be used.  Minimal Blood Pressure Goal= AVERAGE < 140/90; Ideal is an AVERAGE < 135/85. This AVERAGE should be calculated from @ least 5-7 BP readings taken @ different times of day on different days of week. You should not respond to isolated BP readings , but rather the AVERAGE for that week .Please bring your blood pressure cuff to office visits to verify that it is reliable.It can also be checked against the blood pressure device at the pharmacy. Finger or wrist cuffs are not dependable; an arm cuff is.    How to Take Your Blood Pressure You can take your blood pressure at home with a machine. You may need to check your blood pressure at home:  To check if you have high blood pressure (hypertension).  To check your blood pressure over time.  To make sure your blood pressure medicine is working.  Supplies needed: You will need a blood pressure machine, or monitor. You can buy one at a drugstore or online. When choosing one:  Choose one with an arm cuff.  Choose one that wraps around your upper arm. Only one finger should fit between your arm and the cuff.  Do not choose one that measures your blood pressure from your wrist or finger.  Your doctor can suggest a monitor. How to prepare Avoid these things for 30 minutes before checking your blood pressure:  Drinking caffeine.  Drinking alcohol.  Eating.  Smoking.  Exercising.  Five minutes before checking your blood pressure:  Pee.  Sit in a dining chair. Avoid sitting in a soft couch or armchair.  Be quiet. Do not talk.  How to take your blood pressure Follow the instructions that came with your machine. If you have a digital blood pressure monitor, these may be the  instructions: 1. Sit up straight. 2. Place your feet on the floor. Do not cross your ankles or legs. 3. Rest your left arm at the level of your heart. You may rest it on a table, desk, or chair. 4. Pull up your shirt sleeve. 5. Wrap the blood pressure cuff around the upper part of your left arm. The cuff should be 1 inch (2.5 cm) above your elbow. It is best to wrap the cuff around bare skin. 6. Fit the cuff snugly around your arm. You should be able to place only one finger between the cuff and your arm. 7. Put the cord inside the groove of your elbow. 8. Press the power button. 9. Sit quietly while the cuff fills with air and loses air. 10. Write down the numbers on the screen. 11. Wait 2-3 minutes and then repeat steps 1-10.  What do the numbers mean? Two numbers make up your blood pressure. The first number is called systolic pressure. The second is called diastolic pressure. An example of a blood pressure reading is "120 over 80" (or 120/80). If you are an adult and do not have a medical condition, use this guide to find out if your blood pressure is normal: Normal  First number: below 120.  Second number: below 80. Elevated  First number: 120-129.  Second number: below 80. Hypertension stage 1  First number: 130-139.  Second number: 80-89. Hypertension stage 2  First  number: 140 or above.  Second number: 60 or above. Your blood pressure is above normal even if only the top or bottom number is above normal. Follow these instructions at home:  Check your blood pressure as often as your doctor tells you to.  Take your monitor to your next doctor's appointment. Your doctor will: ? Make sure you are using it correctly. ? Make sure it is working right.  Make sure you understand what your blood pressure numbers should be.  Tell your doctor if your medicines are causing side effects. Contact a doctor if:  Your blood pressure keeps being high. Get help right away  if:  Your first blood pressure number is higher than 180.  Your second blood pressure number is higher than 120. This information is not intended to replace advice given to you by your health care provider. Make sure you discuss any questions you have with your health care provider. Document Released: 10/16/2008 Document Revised: 10/01/2016 Document Reviewed: 04/11/2016 Elsevier Interactive Patient Education  [employer].

## 2017-12-24 NOTE — Progress Notes (Signed)
Subjective:    Patient ID: Henry Skinner, male    DOB: October 25, 1946, 72 y.o.   MRN: [id number]  HPI  Henry Skinner is here today to check his blood pressure. He reports that he does not monitor his blood pressure at home and has not checked it in "a while" He reports that he felt that his BP might have been elevated in December after feeling "fuzzy headed" once for a "short time" after drinking " wine, tea,coffee and no water."  He further describes a history of drinking "very little water" but denies any other episodes of feeing " fuzzy headed." He states that he does not feel "lightheaded" or "faint" at this time. He denies chest pain, palpitations, SOB, numbness, tingling, weakness, headaches, or edema. He exercises daily with walking at least 2.5 miles/day as he is preparing to go to the [facility]. He also hikes and avoids salty and fried foods.  He describes taking Tumeric to avoid pain in his legs while preparing for the [facility]. He is due for routine follow up and states that he is aware that he needs to make an appointment. Reports adherence to amlodipine and denies adverse effects.   Review of Systems  Constitutional: Negative for chills, fatigue and fever.  Respiratory: Negative for cough, shortness of breath and wheezing.   Cardiovascular: Negative for chest pain and palpitations.  Gastrointestinal: Negative for diarrhea, nausea and vomiting.  Genitourinary: Negative for dysuria.  Musculoskeletal: Negative for myalgias.  Neurological: Negative for dizziness, weakness, light-headedness, numbness and headaches.  Psychiatric/Behavioral:       Denies depressed or anxious mood   Past Medical History:  Diagnosis Date  . Chickenpox   . Kidney stones   . Skin irritation      Social History   Socioeconomic History  . Marital status: Single    Spouse name: Not on file  . Number of children: Not on file  . Years of education: Not on file  . Highest education level: Not on  file  Social Needs  . Financial resource strain: Not on file  . Food insecurity - worry: Not on file  . Food insecurity - inability: Not on file  . Transportation needs - medical: Not on file  . Transportation needs - non-medical: Not on file  Occupational History  . Not on file  Tobacco Use  . Smoking status: Never Smoker  . Smokeless tobacco: Never Used  Substance and Sexual Activity  . Alcohol use: Yes    Alcohol/week: 0.6 oz    Types: 1 Standard drinks or equivalent per week    Comment: 1 beer a day   . Drug use: Yes    Types: Marijuana    Comment: marijuana, rarely  . Sexual activity: Yes  Other Topics Concern  . Not on file  Social History Narrative  . Not on file    Past Surgical History:  Procedure Laterality Date  . PAROTIDECTOMY Left 03/04/2016   Procedure: PAROTIDECTOMY;  Surgeon: Beverly Gust, MD;  Location: [facility] ORS;  Service: ENT;  Laterality: Left;  . ROTATOR CUFF REPAIR  May 2016  . SKIN SURGERY     L hand, L arm/shoulder     Family History  Problem Relation Age of Onset  . Alcoholism Brother   . Colon cancer Maternal Uncle     No Known Allergies  Current Outpatient Medications on File Prior to Visit  Medication Sig Dispense Refill  . amLODipine (NORVASC) 5 MG tablet Take 1 tablet (  5 mg total) by mouth daily. 90 tablet 3  . Multiple Vitamin (MULTIVITAMIN) tablet Take 1 tablet by mouth daily.    . TURMERIC PO Take 1 tablet by mouth at bedtime.    Marland Kitchen VITAMIN D, CHOLECALCIFEROL, PO Take 2,000 Units by mouth.     No current facility-administered medications on file prior to visit.     BP (!) 150/82   Pulse 76   Temp 98.7 F (37.1 C) (Oral)   Ht 5' 7.5" (1.715 m)   Wt 175 lb (79.4 kg)   SpO2 95%   BMI 27.00 kg/m       Objective:   Physical Exam  Constitutional: He is oriented to person, place, and time. He appears well-developed and well-nourished.  Eyes: Pupils are equal, round, and reactive to light. No scleral icterus.  Neck: Neck  supple.  Cardiovascular: Normal rate and regular rhythm.  Pulmonary/Chest: Effort normal and breath sounds normal. He has no wheezes. He has no rales.  Abdominal: Soft. Bowel sounds are normal.  Musculoskeletal: He exhibits no edema.  Lymphadenopathy:    He has no cervical adenopathy.  Neurological: He is alert and oriented to person, place, and time.  II-Visual fields grossly intact. III/IV/VI-Extraocular movements intact. Pupils reactive bilaterally. V/VII-Smile symmetric, equal eyebrow raise, facial sensation intact VIII- Hearing grossly intact XI-bilateral shoulder shrug XII-midline tongue extension Motor: 5/5 bilaterally with normal tone and bulk Cerebellar: Normal finger-to-nose   Ambulates with a coordinated gait  Skin: Skin is warm and dry.  Psychiatric: He has a normal mood and affect. His behavior is normal. Judgment and thought content normal.       Assessment & Plan:  1. Essential hypertension Retake of Blood pressure resulted in a decrease. He does not monitor his BP so no record of averages is available today; with decreasing readings noted; we discussed options and he will monitor his BP for 2 to 3 weeks, document readings, and bring his BP cuff and readings with him for follow up. BP monitoring parameters and instructions provided to patient. Also provided written instructions for taking BP and parameters for immediate intervention.  Neuro exam normal today; no further symptom of what he noted as "fuzzy headed" has occurred. Advised increase of water intake and decrease of caffeine drinks. We discussed that it is important to remain hydrated. Further advised DASH diet recommendations.  Further follow up for routine care was advised.  Delano Metz, FNP-C

## 2018-02-09 ENCOUNTER — Telehealth: Payer: Self-pay | Admitting: Family Medicine

## 2018-02-09 NOTE — Telephone Encounter (Signed)
Patient dropped off blood pressure readings. Delivered to physician's box.

## 2018-02-10 NOTE — Telephone Encounter (Signed)
In red folder. 

## 2018-02-11 ENCOUNTER — Other Ambulatory Visit: Payer: Self-pay | Admitting: Family Medicine

## 2018-02-12 NOTE — Telephone Encounter (Addendum)
Blood pressures reviewed.  Please let the patient know that they are not quite at goal.  I would suggest we increase his amlodipine to 10 mg by mouth daily.  We can send this to his pharmacy if he is willing.  He needs to follow-up with me for his blood pressure in about a month.  Thanks.

## 2018-02-12 NOTE — Telephone Encounter (Signed)
Tried to reach patient by phone no answer and mailbox is full.

## 2018-02-16 NOTE — Telephone Encounter (Signed)
Tried calling, voicemail full. [place] for pec to speak to patient

## 2018-02-17 ENCOUNTER — Other Ambulatory Visit: Payer: Self-pay

## 2018-02-17 ENCOUNTER — Encounter: Payer: Self-pay | Admitting: Family Medicine

## 2018-02-17 ENCOUNTER — Ambulatory Visit (INDEPENDENT_AMBULATORY_CARE_PROVIDER_SITE_OTHER): Payer: PPO | Admitting: Family Medicine

## 2018-02-17 VITALS — BP 140/82 | HR 71 | Temp 97.9°F | Ht 68.0 in | Wt 177.2 lb

## 2018-02-17 DIAGNOSIS — N529 Male erectile dysfunction, unspecified: Secondary | ICD-10-CM

## 2018-02-17 DIAGNOSIS — Z125 Encounter for screening for malignant neoplasm of prostate: Secondary | ICD-10-CM | POA: Diagnosis not present

## 2018-02-17 DIAGNOSIS — R3915 Urgency of urination: Secondary | ICD-10-CM | POA: Insufficient documentation

## 2018-02-17 DIAGNOSIS — Z800 Family history of malignant neoplasm of digestive organs: Secondary | ICD-10-CM | POA: Diagnosis not present

## 2018-02-17 DIAGNOSIS — R351 Nocturia: Secondary | ICD-10-CM | POA: Diagnosis not present

## 2018-02-17 DIAGNOSIS — I10 Essential (primary) hypertension: Secondary | ICD-10-CM | POA: Diagnosis not present

## 2018-02-17 DIAGNOSIS — Z1211 Encounter for screening for malignant neoplasm of colon: Secondary | ICD-10-CM

## 2018-02-17 DIAGNOSIS — N400 Enlarged prostate without lower urinary tract symptoms: Secondary | ICD-10-CM | POA: Insufficient documentation

## 2018-02-17 NOTE — Progress Notes (Signed)
Tommi Rumps, MD Phone: [phone]  Henry Skinner is a 72 y.o. male who presents today for f/u.  HYPERTENSION  Disease Monitoring  Home BP Monitoring 127-140s/70s-80s Chest pain- no    Dyspnea- no Medications  Compliance-  Taking amlodipine.  Edema- no  Notes he feels the amlodipine caused him to have some erectile issues earlier this year.  He was unable to get an erection in January at times though since then this has resolved and he has had no issues.  He is able to ejaculate.  He reports some intermittent nocturia.  Occurs at night mostly during the winter when he gets up to heat his house.  No starting or stopping.  No dribbling.  No straining.  No dysuria.  He has been staying active by walking and he is training to go to the [facility].  He is coming up on being due for colon cancer screening.  He has been hesitant to do a colonoscopy previously.  He does report a family history of colon cancer in his uncle before age 88.  Social History   Tobacco Use  Smoking Status Never Smoker  Smokeless Tobacco Never Used     ROS see history of present illness  Objective  Physical Exam Vitals:   02/17/18 1122  BP: 140/82  Pulse: 71  Temp: 97.9 F (36.6 C)  SpO2: 96%    BP Readings from Last 3 Encounters:  02/17/18 140/82  12/24/17 (!) 150/82  03/25/17 138/76   Wt Readings from Last 3 Encounters:  02/17/18 177 lb 3.2 oz (80.4 kg)  12/24/17 175 lb (79.4 kg)  03/25/17 169 lb (76.7 kg)    Physical Exam  Constitutional: No distress.  Cardiovascular: Normal rate, regular rhythm and normal heart sounds.  Pulmonary/Chest: Effort normal and breath sounds normal.  Genitourinary:  Genitourinary Comments: Patient declined rectal exam  Musculoskeletal: He exhibits no edema.  Neurological: He is alert. Gait normal.  Skin: Skin is warm and dry. He is not diaphoretic.     Assessment/Plan: Please see individual problem list.  Essential hypertension Borderline.   Discussed increasing amlodipine though he is hesitant to do this.  He will continue to stay active and continue to monitor his blood pressures.  If they go up would need to consider increasing dosage.  He will return for fasting labs.  Nocturia Minimal nocturia with going 2 times at night at most.  Discussed checking his prostate though he deferred this.  He is accepting of PSA check.  Will check urinalysis as well.  Family history of colon cancer In his uncle at an age less than 30.  Will refer to GI for colonoscopy.  Erectile dysfunction This was an issue briefly in January.  Has not recurred.  He will monitor for recurrence.  Orders Placed This Encounter  Procedures  . HgB A1c    Standing Status:   Future    Standing Expiration Date:   02/18/2019  . Comp Met (CMET)    Standing Status:   Future    Standing Expiration Date:   02/18/2019  . Lipid panel    Standing Status:   Future    Standing Expiration Date:   02/18/2019  . PSA, Medicare    Standing Status:   Future    Standing Expiration Date:   02/18/2019  . Ambulatory referral to Gastroenterology    Referral Priority:   Routine    Referral Type:   Consultation    Referral Reason:   Specialty Services Required  Number of Visits Requested:   1  . POCT Urinalysis Dipstick    Standing Status:   Future    Standing Expiration Date:   02/18/2019    No orders of the defined types were placed in this encounter.    Tommi Rumps, MD [place]

## 2018-02-17 NOTE — Assessment & Plan Note (Signed)
This was an issue briefly in January.  Has not recurred.  He will monitor for recurrence.

## 2018-02-17 NOTE — Telephone Encounter (Signed)
Patient is in the office today and will discuss at visit

## 2018-02-17 NOTE — Assessment & Plan Note (Signed)
Borderline.  Discussed increasing amlodipine though he is hesitant to do this.  He will continue to stay active and continue to monitor his blood pressures.  If they go up would need to consider increasing dosage.  He will return for fasting labs.

## 2018-02-17 NOTE — Assessment & Plan Note (Signed)
In his uncle at an age less than 26.  Will refer to GI for colonoscopy.

## 2018-02-17 NOTE — Assessment & Plan Note (Signed)
Minimal nocturia with going 2 times at night at most.  Discussed checking his prostate though he deferred this.  He is accepting of PSA check.  Will check urinalysis as well.

## 2018-02-17 NOTE — Patient Instructions (Signed)
Nice to see you. Please schedule a fasting lab appointment for sometime this week or next week. Please continue to monitor your blood pressure.  Starts to go up please let us know. We will order cologuard for you.  Please do not complete this until after 03/02/18.

## 2018-03-02 ENCOUNTER — Other Ambulatory Visit (INDEPENDENT_AMBULATORY_CARE_PROVIDER_SITE_OTHER): Payer: PPO

## 2018-03-02 ENCOUNTER — Telehealth: Payer: Self-pay

## 2018-03-02 DIAGNOSIS — R351 Nocturia: Secondary | ICD-10-CM

## 2018-03-02 DIAGNOSIS — I10 Essential (primary) hypertension: Secondary | ICD-10-CM

## 2018-03-02 DIAGNOSIS — Z125 Encounter for screening for malignant neoplasm of prostate: Secondary | ICD-10-CM | POA: Diagnosis not present

## 2018-03-02 LAB — COMPREHENSIVE METABOLIC PANEL
ALBUMIN: 4.2 g/dL (ref 3.5–5.2)
ALK PHOS: 61 U/L (ref 39–117)
ALT: 24 U/L (ref 0–53)
AST: 18 U/L (ref 0–37)
BILIRUBIN TOTAL: 0.7 mg/dL (ref 0.2–1.2)
BUN: 14 mg/dL (ref 6–23)
CALCIUM: 9.3 mg/dL (ref 8.4–10.5)
CO2: 28 meq/L (ref 19–32)
CREATININE: 1.01 mg/dL (ref 0.40–1.50)
Chloride: 105 mEq/L (ref 96–112)
GFR: 77.65 mL/min (ref >60.00)
Glucose, Bld: 96 mg/dL (ref 70–99)
Potassium: 4.7 mEq/L (ref 3.5–5.1)
Sodium: 140 mEq/L (ref 135–145)
TOTAL PROTEIN: 6.9 g/dL (ref 6.0–8.3)

## 2018-03-02 LAB — POCT URINALYSIS DIPSTICK
Bilirubin, UA: NEGATIVE
Blood, UA: NEGATIVE
Glucose, UA: NEGATIVE
Ketones, UA: NEGATIVE
LEUKOCYTES UA: NEGATIVE
NITRITE UA: NEGATIVE
PH UA: 6 (ref 5.0–8.0)
PROTEIN UA: NEGATIVE
Spec Grav, UA: 1.02 (ref 1.010–1.025)
UROBILINOGEN UA: 0.2 U/dL

## 2018-03-02 LAB — LIPID PANEL
CHOLESTEROL: 155 mg/dL (ref 0–200)
HDL: 53.6 mg/dL (ref >39.00)
LDL Cholesterol: 91 mg/dL (ref 0–99)
NonHDL: 101.49
TRIGLYCERIDES: 50 mg/dL (ref 0.0–149.0)
Total CHOL/HDL Ratio: 3
VLDL: 10 mg/dL (ref 0.0–40.0)

## 2018-03-02 LAB — HEMOGLOBIN A1C: Hgb A1c MFr Bld: 5.8 % (ref 4.6–6.5)

## 2018-03-02 LAB — PSA, MEDICARE: PSA: 2.58 ng/mL (ref 0.10–4.00)

## 2018-03-02 NOTE — Telephone Encounter (Signed)
Copied from [place] [id number]. Topic: Quick Communication - Office Called Patient >> Mar 02, 2018  1:34 PM Synthia Innocent wrote: Reason for CRM: returning call

## 2018-03-04 NOTE — Telephone Encounter (Signed)
Talked with patient about lab results.

## 2018-03-19 ENCOUNTER — Ambulatory Visit (INDEPENDENT_AMBULATORY_CARE_PROVIDER_SITE_OTHER): Payer: PPO

## 2018-03-19 VITALS — BP 132/72 | HR 79 | Temp 98.3°F | Resp 14 | Ht 67.5 in | Wt 169.1 lb

## 2018-03-19 DIAGNOSIS — Z0000 Encounter for general adult medical examination without abnormal findings: Secondary | ICD-10-CM

## 2018-03-19 DIAGNOSIS — Z23 Encounter for immunization: Secondary | ICD-10-CM

## 2018-03-19 NOTE — Patient Instructions (Addendum)
  Henry Skinner , Thank you for taking time to come for your Medicare Wellness Visit. I appreciate your ongoing commitment to your health goals. Please review the following plan we discussed and let me know if I can assist you in the future.   Follow up as needed.    Bring a copy of your [place] and/or Living Will to be scanned into chart.  Have a great day!  These are the goals we discussed: Goals    . Maintain Healthy Lifestyle     Stay hydrated Walk for exercise Healthy diet       This is a list of the screening recommended for you and due dates:  Health Maintenance  Topic Date Due  . Colon Cancer Screening  04/16/2018*  . Flu Shot  06/17/2018  . Tetanus Vaccine  01/07/2026  .  Hepatitis C: One time screening is recommended by Center for Disease Control  (CDC) for  adults born from [birth year] through 1963.   Completed  . Pneumonia vaccines  Completed  *Topic was postponed. The date shown is not the original due date.

## 2018-03-19 NOTE — Progress Notes (Signed)
Subjective:   Henry Skinner is a 72 y.o. male who presents for Medicare Annual/Subsequent preventive examination.  Review of Systems:  No ROS.  Medicare Wellness Visit. Additional risk factors are reflected in the social history.  Cardiac Risk Factors include: advanced age (>59men, >73 women);hypertension;male gender     Objective:    Vitals: BP 132/72 (BP Location: Left Arm, Patient Position: Sitting, Cuff Size: Normal)   Pulse 79   Temp 98.3 F (36.8 C) (Oral)   Resp 14   Ht 5' 7.5" (1.715 m)   Wt 169 lb 1.9 oz (76.7 kg)   SpO2 95%   BMI 26.10 kg/m   Body mass index is 26.1 kg/m.  Advanced Directives 03/19/2018 03/18/2017 02/25/2016  Does Patient Have a Medical Advance Directive? Yes No Yes  Type of Advance Directive Living will;Healthcare Power of [place];Living will  Does patient want to make changes to medical advance directive? No - Patient declined - -  Copy of [place] in Chart? No - copy requested - -  Would patient like information on creating a medical advance directive? - Yes (MAU/Ambulatory/Procedural Areas - Information given) -    Tobacco Social History   Tobacco Use  Smoking Status Never Smoker  Smokeless Tobacco Never Used     Counseling given: Not Answered   Clinical Intake:  Pre-visit preparation completed: Yes  Pain : No/denies pain     Nutritional Status: BMI 25 -29 Overweight Diabetes: No  How often do you need to have someone help you when you read instructions, pamphlets, or other written materials from your doctor or pharmacy?: 1 - Never  Interpreter Needed?: No     Past Medical History:  Diagnosis Date  . Chickenpox   . Kidney stones   . Skin irritation    Past Surgical History:  Procedure Laterality Date  . PAROTIDECTOMY Left 03/04/2016   Procedure: PAROTIDECTOMY;  Surgeon: Beverly Gust, MD;  Location: [facility] ORS;  Service: ENT;  Laterality: Left;  . ROTATOR CUFF REPAIR   May 2016  . SKIN SURGERY     L hand, L arm/shoulder    Family History  Problem Relation Age of Onset  . Alcoholism Brother   . Colon cancer Maternal Uncle    Social History   Socioeconomic History  . Marital status: Single    Spouse name: Not on file  . Number of children: Not on file  . Years of education: Not on file  . Highest education level: Not on file  Occupational History  . Not on file  Social Needs  . Financial resource strain: Not hard at all  . Food insecurity:    Worry: Never true    Inability: Never true  . Transportation needs:    Medical: No    Non-medical: No  Tobacco Use  . Smoking status: Never Smoker  . Smokeless tobacco: Never Used  Substance and Sexual Activity  . Alcohol use: Yes    Alcohol/week: 0.6 oz    Types: 1 Standard drinks or equivalent per week    Comment: 1 beer a day   . Drug use: Yes    Types: Marijuana    Comment: marijuana, rarely  . Sexual activity: Yes  Lifestyle  . Physical activity:    Days per week: Not on file    Minutes per session: Not on file  . Stress: Not on file  Relationships  . Social connections:    Talks on phone: Not  on file    Gets together: Not on file    Attends religious service: Not on file    Active member of club or organization: Not on file    Attends meetings of clubs or organizations: Not on file    Relationship status: Not on file  Other Topics Concern  . Not on file  Social History Narrative  . Not on file    Outpatient Encounter Medications as of 03/19/2018  Medication Sig  . amLODipine (NORVASC) 5 MG tablet TAKE 1 TABLET (5 MG TOTAL) BY MOUTH DAILY.  Marland Kitchen b complex vitamins tablet Take 1 tablet by mouth daily.  . Multiple Vitamin (MULTIVITAMIN) tablet Take 1 tablet by mouth daily.  . TURMERIC PO Take 1 tablet by mouth at bedtime.  Marland Kitchen VITAMIN D, CHOLECALCIFEROL, PO Take 2,000 Units by mouth.   No facility-administered encounter medications on file as of 03/19/2018.     Activities of Daily  Living In your present state of health, do you have any difficulty performing the following activities: 03/19/2018  Hearing? N  Vision? N  Difficulty concentrating or making decisions? N  Walking or climbing stairs? N  Dressing or bathing? N  Doing errands, shopping? N  Preparing Food and eating ? N  Using the Toilet? N  In the past six months, have you accidently leaked urine? N  Do you have problems with loss of bowel control? N  Managing your Medications? N  Managing your Finances? N  Housekeeping or managing your Housekeeping? N  Some recent data might be hidden    Patient Care Team: Leone Haven, MD as PCP - General (Family Medicine)   Assessment:   This is a routine wellness examination for Henry Skinner.  The goal of the wellness visit is to assist the patient how to close the gaps in care and create a preventative care plan for the patient.   The roster of all physicians providing medical care to patient is listed in the Snapshot section of the chart.  Osteoporosis risk reviewed.    Safety issues reviewed; Smoke and carbon monoxide detectors in the home. No firearms in the home. Wears seatbelts when driving or riding with others. No violence in the home.  They do not have excessive sun exposure.  Discussed the need for sun protection: hats, long sleeves and the use of sunscreen if there is significant sun exposure.  Patient is alert, normal appearance, oriented to person/place/and time.  Correctly identified the president of the [place] and recalls of 5/5 words. Performs simple calculations and can read correct time from watch face. Displays appropriate judgement.  No new identified risk were noted.  No failures at ADL's or IADL's.    BMI- discussed the importance of a healthy diet, water intake and the benefits of aerobic exercise. Educational material provided.   24 hour diet recall: Lean meat, vegetable diet  Dental- UTD.  Dr. Albesa Seen.   Sleep patterns- Sleeps  without issues.   PNA23 vaccine administered R deltoid, tolerated well. Educational material provided.  Health maintenance gaps- closed.  Patient Concerns: None at this time. Follow up with PCP as needed.  Exercise Activities and Dietary recommendations Current Exercise Habits: Home exercise routine, Type of exercise: walking, Frequency (Times/Week): 4, Intensity: Moderate  Goals    . Maintain Healthy Lifestyle     Stay hydrated Walk for exercise Healthy diet       Fall Risk Fall Risk  03/19/2018 12/24/2017 10/30/2016  Falls in the past year? No  No No   Depression Screen PHQ 2/9 Scores 03/19/2018 12/24/2017 03/18/2017 10/30/2016  PHQ - 2 Score 0 0 0 0  PHQ- 9 Score - - 0 -    Cognitive Function MMSE - Mini Mental State Exam 03/19/2018 03/18/2017  Orientation to time 5 5  Orientation to Place 5 5  Registration 3 3  Attention/ Calculation 5 5  Recall 3 3  Language- name 2 objects 2 2  Language- repeat 1 1  Language- follow 3 step command 3 3  Language- read & follow direction 1 1  Write a sentence 1 1  Copy design 1 1  Total score 30 30        Immunization History  Administered Date(s) Administered  . Influenza-Unspecified 10/20/2016  . Pneumococcal Conjugate-13 12/25/2016  . Pneumococcal Polysaccharide-23 03/19/2018  . Tdap 01/08/2016  . Zoster 12/18/2006   Screening Tests Health Maintenance  Topic Date Due  . COLONOSCOPY  04/16/2018 (Originally 04/24/1998)  . INFLUENZA VACCINE  06/17/2018  . TETANUS/TDAP  01/07/2026  . Hepatitis C Screening  Completed  . PNA vac Low Risk Adult  Completed       Plan:    End of life planning; Advance aging; Advanced directives discussed. Copy of current HCPOA/Living Will requested.    I have personally reviewed and noted the following in the patient's chart:   . Medical and social history . Use of alcohol, tobacco or illicit drugs  . Current medications and supplements . Functional ability and status . Nutritional  status . Physical activity . Advanced directives . List of other physicians . Hospitalizations, surgeries, and ER visits in previous 12 months . Vitals . Screenings to include cognitive, depression, and falls . Referrals and appointments  In addition, I have reviewed and discussed with patient certain preventive protocols, quality metrics, and best practice recommendations. A written personalized care plan for preventive services as well as general preventive health recommendations were provided to patient.     Varney Biles, LPN  0/0/3491   Reviewed above information.  Agree with assessment and plan.    Dr Nicki Reaper

## 2018-03-22 ENCOUNTER — Encounter: Payer: Self-pay | Admitting: *Deleted

## 2018-05-21 ENCOUNTER — Encounter: Payer: Self-pay | Admitting: Family Medicine

## 2018-05-21 ENCOUNTER — Ambulatory Visit (INDEPENDENT_AMBULATORY_CARE_PROVIDER_SITE_OTHER): Payer: PPO | Admitting: Family Medicine

## 2018-05-21 VITALS — BP 136/78 | HR 82 | Temp 98.2°F | Resp 16 | Ht 67.5 in | Wt 173.0 lb

## 2018-05-21 DIAGNOSIS — R3915 Urgency of urination: Secondary | ICD-10-CM

## 2018-05-21 DIAGNOSIS — D110 Benign neoplasm of parotid gland: Secondary | ICD-10-CM

## 2018-05-21 DIAGNOSIS — L570 Actinic keratosis: Secondary | ICD-10-CM

## 2018-05-21 DIAGNOSIS — I10 Essential (primary) hypertension: Secondary | ICD-10-CM

## 2018-05-21 DIAGNOSIS — K119 Disease of salivary gland, unspecified: Secondary | ICD-10-CM | POA: Diagnosis not present

## 2018-05-21 DIAGNOSIS — K118 Other diseases of salivary glands: Secondary | ICD-10-CM

## 2018-05-21 NOTE — Progress Notes (Signed)
  Tommi Rumps, MD Phone: [phone]  Henry Skinner is a 72 y.o. male who presents today for f/u.  CC: htn, history of skin cancer, history of parotid mass, urinary urgency  HYPERTENSION  Disease Monitoring  Home BP Monitoring not checking Chest pain- no    Dyspnea- no Medications  Compliance-  Taking amlodipine.  Edema- no  History of skin cancer: Notes a new spot on his right nose.  He sees dermatology next Tuesday.  History of parotid mass: History of a benign parotid mass.  Patient feels his left parotid gland might be slightly more enlarged than it was previously and slightly firmer.  Urinary urgency: Patient notes he was having some issues with urinary urgency and he started on an over-the-counter supplement that has been incredibly beneficial.  He does not know the name.  He has no urgency now.  No stream issues.  No straining.  No dysuria.  No urinary frequency.   Social History   Tobacco Use  Smoking Status Never Smoker  Smokeless Tobacco Never Used     ROS see history of present illness  Objective  Physical Exam Vitals:   05/21/18 1010  BP: 136/78  Pulse: 82  Resp: 16  Temp: 98.2 F (36.8 C)  SpO2: 95%    BP Readings from Last 3 Encounters:  05/21/18 136/78  03/19/18 132/72  02/17/18 140/82   Wt Readings from Last 3 Encounters:  05/21/18 173 lb (78.5 kg)  03/19/18 169 lb 1.9 oz (76.7 kg)  02/17/18 177 lb 3.2 oz (80.4 kg)    Physical Exam  Constitutional: No distress.  HENT:  No gross palpable masses noted in the left parotid gland compared to the right parotid gland, possible firmness in this area, no significant swelling noted  Cardiovascular: Normal rate, regular rhythm and normal heart sounds.  Pulmonary/Chest: Effort normal and breath sounds normal.  Musculoskeletal: He exhibits no edema.  Neurological: He is alert.  Skin: Skin is warm and dry. He is not diaphoretic.  Slight darkened rough skin lesion on the right nasal  bridge   Assessment/Plan: Please see individual problem list.  Essential hypertension Controlled.  Continue current regimen.  Actinic keratoses Suspect actinic keratosis on his nose.  He will see dermatology next week.  Parotid mass Refer back to ENT for reevaluation.  The firmness noted potentially could be scar tissue related though needs to be evaluated by his surgeon.  Urinary urgency Improved with over-the-counter supplement.  He will let us know what the supplement is.   Orders Placed This Encounter  Procedures  . Ambulatory referral to ENT    Referral Priority:   Routine    Referral Type:   Consultation    Referral Reason:   Specialty Services Required    Requested Specialty:   Otolaryngology    Number of Visits Requested:   1    No orders of the defined types were placed in this encounter.    Tommi Rumps, MD [place]

## 2018-05-21 NOTE — Patient Instructions (Signed)
Nice to see you. Please continue your blood pressure medication. Please see your dermatologist.  We will get you to see ENT again. Please contact us to let us know what supplement you are taking to help you urinate.

## 2018-05-22 NOTE — Assessment & Plan Note (Signed)
Improved with over-the-counter supplement.  He will let us know what the supplement is.

## 2018-05-22 NOTE — Assessment & Plan Note (Signed)
Controlled. Continue current regimen. 

## 2018-05-22 NOTE — Assessment & Plan Note (Signed)
Suspect actinic keratosis on his nose.  He will see dermatology next week.

## 2018-05-22 NOTE — Assessment & Plan Note (Signed)
Refer back to ENT for reevaluation.  The firmness noted potentially could be scar tissue related though needs to be evaluated by his surgeon.

## 2018-05-25 DIAGNOSIS — D2272 Melanocytic nevi of left lower limb, including hip: Secondary | ICD-10-CM | POA: Diagnosis not present

## 2018-05-25 DIAGNOSIS — Z85828 Personal history of other malignant neoplasm of skin: Secondary | ICD-10-CM | POA: Diagnosis not present

## 2018-05-25 DIAGNOSIS — D2271 Melanocytic nevi of right lower limb, including hip: Secondary | ICD-10-CM | POA: Diagnosis not present

## 2018-05-25 DIAGNOSIS — X32XXXA Exposure to sunlight, initial encounter: Secondary | ICD-10-CM | POA: Diagnosis not present

## 2018-05-25 DIAGNOSIS — Z08 Encounter for follow-up examination after completed treatment for malignant neoplasm: Secondary | ICD-10-CM | POA: Diagnosis not present

## 2018-05-25 DIAGNOSIS — D2261 Melanocytic nevi of right upper limb, including shoulder: Secondary | ICD-10-CM | POA: Diagnosis not present

## 2018-05-25 DIAGNOSIS — L570 Actinic keratosis: Secondary | ICD-10-CM | POA: Diagnosis not present

## 2018-05-25 DIAGNOSIS — D225 Melanocytic nevi of trunk: Secondary | ICD-10-CM | POA: Diagnosis not present

## 2018-05-25 DIAGNOSIS — L821 Other seborrheic keratosis: Secondary | ICD-10-CM | POA: Diagnosis not present

## 2018-05-25 DIAGNOSIS — D2262 Melanocytic nevi of left upper limb, including shoulder: Secondary | ICD-10-CM | POA: Diagnosis not present

## 2018-06-10 ENCOUNTER — Telehealth: Payer: Self-pay | Admitting: Family Medicine

## 2018-06-10 NOTE — Telephone Encounter (Signed)
Noted. I typically advise against using supplements as they could interact unfavorably with his prescription medications and are not monitored or approved by the FDA.

## 2018-06-10 NOTE — Telephone Encounter (Signed)
fyi

## 2018-06-10 NOTE — Telephone Encounter (Signed)
Tried calling, no vm. [place] for pec to speak to patient and inform him of message below

## 2018-06-10 NOTE — Telephone Encounter (Signed)
Per Dr Caryl Bis would like to know what medicine pt is taking. [place] supports healthy prostate function. Please advise? Thank you!

## 2018-06-24 NOTE — Telephone Encounter (Signed)
Sent letter

## 2018-11-22 ENCOUNTER — Ambulatory Visit: Payer: PPO | Admitting: Family Medicine

## 2018-11-22 DIAGNOSIS — Z0289 Encounter for other administrative examinations: Secondary | ICD-10-CM

## 2018-11-29 ENCOUNTER — Ambulatory Visit (INDEPENDENT_AMBULATORY_CARE_PROVIDER_SITE_OTHER): Payer: PPO | Admitting: Family Medicine

## 2018-11-29 ENCOUNTER — Encounter: Payer: Self-pay | Admitting: Family Medicine

## 2018-11-29 VITALS — BP 148/80 | HR 70 | Temp 98.3°F | Resp 18 | Ht 67.0 in | Wt 171.4 lb

## 2018-11-29 DIAGNOSIS — I10 Essential (primary) hypertension: Secondary | ICD-10-CM

## 2018-11-29 NOTE — Progress Notes (Signed)
Subjective:    Patient ID: Henry Skinner, male    DOB: Oct 26, 1946, 73 y.o.   MRN: [id number]  HPI   Patient presents to clinic complaining of elevated blood pressures off and on for the past 2 to 3 weeks.  Patient states he checked his blood pressure this morning approximately 20 minutes after his amlodipine dose, and got a reading of 165/90.  Patient states he is also gotten readings in the 150s over 80s, and some 130s over 80s.    Denies any chest pain, palpitations, leg swelling, shortness of breath or wheezing, feeling faint or dizzy.  Patient states he has noticed some increased stress in his life lately, his house is full of clutter and he has been thinking about how he must slowly begin to clean and organize.   Patient Active Problem List   Diagnosis Date Noted  . Urinary urgency 02/17/2018  . Family history of colon cancer 02/17/2018  . Erectile dysfunction 02/17/2018  . Essential hypertension 01/06/2017  . Skin cancer 01/06/2017  . Actinic keratoses 07/03/2016  . Parotid mass 02/01/2016  . Overweight 01/08/2016  . History of kidney stones 01/08/2016   Social History   Tobacco Use  . Smoking status: Never Smoker  . Smokeless tobacco: Never Used  Substance Use Topics  . Alcohol use: Yes    Alcohol/week: 1.0 standard drinks    Types: 1 Standard drinks or equivalent per week    Comment: 1 beer a day    Review of Systems  Constitutional: Negative for chills, fatigue and fever.  HENT: Negative for congestion, ear pain, sinus pain and sore throat.   Eyes: Negative.   Respiratory: Negative for cough, shortness of breath and wheezing.   Cardiovascular: Negative for chest pain, palpitations and leg swelling. BP running high for a few weeks.  Gastrointestinal: Negative for abdominal pain, diarrhea, nausea and vomiting.  Genitourinary: Negative for dysuria, frequency and urgency.  Musculoskeletal: Negative for arthralgias and myalgias.  Skin: Negative for color change,  pallor and rash.  Neurological: Negative for syncope, light-headedness and headaches.  Psychiatric/Behavioral: The patient is not nervous/anxious.       Objective:   Physical Exam Vitals signs and nursing note reviewed.  Constitutional:      General: He is not in acute distress.    Appearance: He is normal weight. He is not ill-appearing, toxic-appearing or diaphoretic.  HENT:     Head: Normocephalic and atraumatic.     Right Ear: Tympanic membrane and ear canal normal.     Left Ear: Tympanic membrane and ear canal normal.     Nose: Nose normal.     Mouth/Throat:     Mouth: Mucous membranes are moist.  Eyes:     General: No scleral icterus.    Extraocular Movements: Extraocular movements intact.     Conjunctiva/sclera: Conjunctivae normal.  Neck:     Musculoskeletal: Neck supple. No neck rigidity.  Cardiovascular:     Rate and Rhythm: Normal rate and regular rhythm.     Heart sounds: Normal heart sounds. No murmur.  Musculoskeletal:     Right lower leg: No edema.     Left lower leg: No edema.  Lymphadenopathy:     Cervical: No cervical adenopathy.  Skin:    General: Skin is warm and dry.     Coloration: Skin is not pale.  Neurological:     Mental Status: He is alert and oriented to person, place, and time.     Gait: Gait normal.  Psychiatric:        Mood and Affect: Mood normal.        Behavior: Behavior normal.     BP Readings from Last 3 Encounters:  05/21/18 136/78  03/19/18 132/72  02/17/18 140/82   Vitals:   11/29/18 1551  BP: (!) 148/80  Pulse: 70  Resp: 18  Temp: 98.3 F (36.8 C)  SpO2: 96%      Assessment & Plan:     HTN -  patient's last 3 readings from his previous 3 office visits all were very good.  Patient's blood pressure reading in office today is decently controlled.  Patient does plan to work on improving his clutter in his home and he feels this will help overall stress in his life to be less.  We also discussed patient checking his  blood pressure at least 1 hour after taking his amlodipine in the morning and keeping a log of readings over the next 2 weeks.  He then will return to office for nurse visit for recheck of blood pressure.  Before we make any medication changes I would like to have more documentation of his BP readings over time.  Nurse visit in 2 weeks for BP recheck

## 2018-11-29 NOTE — Patient Instructions (Signed)
Check BP at home at least 1 hour after taking medication to allow medication some time to work  Monitor BP for next 2 weeks at home and keep log  Come back for nurse visit to check BP

## 2018-12-15 ENCOUNTER — Ambulatory Visit (INDEPENDENT_AMBULATORY_CARE_PROVIDER_SITE_OTHER): Payer: PPO | Admitting: Family Medicine

## 2018-12-15 ENCOUNTER — Encounter: Payer: Self-pay | Admitting: Family Medicine

## 2018-12-15 ENCOUNTER — Ambulatory Visit: Payer: PPO

## 2018-12-15 DIAGNOSIS — J40 Bronchitis, not specified as acute or chronic: Secondary | ICD-10-CM | POA: Diagnosis not present

## 2018-12-15 MED ORDER — BENZONATATE 200 MG PO CAPS: 200.0000 mg | ORAL_CAPSULE | Freq: Two times a day (BID) | ORAL | 0 refills | Status: DC | PRN

## 2018-12-15 NOTE — Progress Notes (Signed)
  Tommi Rumps, MD Phone: [phone]  Henry Skinner is a 73 y.o. male who presents today for same day visit.   CC: cough  Patient notes symptoms started 14 days ago with a dry hacking cough.  He also had runny nose for the first several days.  He notes no significant postnasal drip.  He has had some sneezing.  No wheezing or fevers.  He has progressively improved over the last several days.  No dyspnea.  He was taking Coricidin for this.  Also taking Delsym for cough.  Social History   Tobacco Use  Smoking Status Never Smoker  Smokeless Tobacco Never Used     ROS see history of present illness  Objective  Physical Exam Vitals:   12/15/18 1156  BP: 140/86  Pulse: 99  Temp: 98 F (36.7 C)  SpO2: 96%    BP Readings from Last 3 Encounters:  12/15/18 140/86  11/29/18 (!) 148/80  05/21/18 136/78   Wt Readings from Last 3 Encounters:  12/15/18 168 lb 6.4 oz (76.4 kg)  11/29/18 171 lb 6.4 oz (77.7 kg)  05/21/18 173 lb (78.5 kg)    Physical Exam Constitutional:      General: He is not in acute distress.    Appearance: He is not diaphoretic.  HENT:     Head: Normocephalic and atraumatic.     Right Ear: Tympanic membrane normal.     Left Ear: Tympanic membrane normal.     Mouth/Throat:     Mouth: Mucous membranes are moist.     Pharynx: Oropharynx is clear.  Eyes:     Conjunctiva/sclera: Conjunctivae normal.     Pupils: Pupils are equal, round, and reactive to light.  Cardiovascular:     Rate and Rhythm: Normal rate and regular rhythm.     Heart sounds: Normal heart sounds.  Pulmonary:     Effort: Pulmonary effort is normal. No respiratory distress.     Breath sounds: No wheezing.     Comments: Coarse breath sounds bilaterally Skin:    General: Skin is warm and dry.  Neurological:     Mental Status: He is alert.      Assessment/Plan: Please see individual problem list.  Bronchitis Progressively improving.  No focal findings indicate bacterial  illness.  Discussed that antibiotics are not indicated.  Offered prednisone given coarseness and reactive coughing though he declined.  Will treat with Tessalon.  He will contact us if this is not beneficial and we would consider other cough medications.  Given return precautions.   No orders of the defined types were placed in this encounter.   Meds ordered this encounter  Medications  . benzonatate (TESSALON) 200 MG capsule    Sig: Take 1 capsule (200 mg total) by mouth 2 (two) times daily as needed for cough.    Dispense:  20 capsule    Refill:  0     Tommi Rumps, MD [place]

## 2018-12-15 NOTE — Patient Instructions (Signed)
Nice to see you. Please try the Tessalon for cough.  If it is not helpful please let us know. If you develop cough productive of blood, fevers, or shortness of breath please be reevaluated.  If you are not improving over the next week please let us know.

## 2018-12-15 NOTE — Assessment & Plan Note (Signed)
Progressively improving.  No focal findings indicate bacterial illness.  Discussed that antibiotics are not indicated.  Offered prednisone given coarseness and reactive coughing though he declined.  Will treat with Tessalon.  He will contact us if this is not beneficial and we would consider other cough medications.  Given return precautions.

## 2019-01-30 ENCOUNTER — Other Ambulatory Visit: Payer: Self-pay | Admitting: Family Medicine

## 2019-03-08 DIAGNOSIS — D225 Melanocytic nevi of trunk: Secondary | ICD-10-CM | POA: Diagnosis not present

## 2019-03-08 DIAGNOSIS — D2261 Melanocytic nevi of right upper limb, including shoulder: Secondary | ICD-10-CM | POA: Diagnosis not present

## 2019-03-08 DIAGNOSIS — L821 Other seborrheic keratosis: Secondary | ICD-10-CM | POA: Diagnosis not present

## 2019-03-08 DIAGNOSIS — D2262 Melanocytic nevi of left upper limb, including shoulder: Secondary | ICD-10-CM | POA: Diagnosis not present

## 2019-03-23 ENCOUNTER — Other Ambulatory Visit: Payer: Self-pay

## 2019-03-23 ENCOUNTER — Telehealth: Payer: Self-pay | Admitting: Family Medicine

## 2019-03-23 ENCOUNTER — Ambulatory Visit (INDEPENDENT_AMBULATORY_CARE_PROVIDER_SITE_OTHER): Payer: PPO

## 2019-03-23 ENCOUNTER — Encounter: Payer: Self-pay | Admitting: Family Medicine

## 2019-03-23 ENCOUNTER — Ambulatory Visit (INDEPENDENT_AMBULATORY_CARE_PROVIDER_SITE_OTHER): Payer: PPO | Admitting: Family Medicine

## 2019-03-23 DIAGNOSIS — Z800 Family history of malignant neoplasm of digestive organs: Secondary | ICD-10-CM | POA: Diagnosis not present

## 2019-03-23 DIAGNOSIS — L570 Actinic keratosis: Secondary | ICD-10-CM | POA: Diagnosis not present

## 2019-03-23 DIAGNOSIS — J40 Bronchitis, not specified as acute or chronic: Secondary | ICD-10-CM

## 2019-03-23 DIAGNOSIS — R7303 Prediabetes: Secondary | ICD-10-CM | POA: Diagnosis not present

## 2019-03-23 DIAGNOSIS — Z0000 Encounter for general adult medical examination without abnormal findings: Secondary | ICD-10-CM

## 2019-03-23 DIAGNOSIS — I10 Essential (primary) hypertension: Secondary | ICD-10-CM | POA: Diagnosis not present

## 2019-03-23 MED ORDER — AMLODIPINE BESYLATE 10 MG PO TABS: 10.0000 mg | ORAL_TABLET | Freq: Every day | ORAL | 1 refills | Status: DC

## 2019-03-23 NOTE — Telephone Encounter (Signed)
Called pt and pt has been scheduled for appt and mail pt an appt reminder as requested.

## 2019-03-23 NOTE — Assessment & Plan Note (Signed)
Continue diet and exercise.  Check lab work in 1 month.

## 2019-03-23 NOTE — Assessment & Plan Note (Signed)
Resolved

## 2019-03-23 NOTE — Patient Instructions (Addendum)
  [name] , Thank you for taking time to come for your Medicare Wellness Visit. I appreciate your ongoing commitment to your health goals. Please review the following plan we discussed and let me know if I can assist you in the future.   These are the goals we discussed: Goals      Patient Stated   . DIET - REDUCE SUGAR INTAKE (pt-stated)     Continue reducing sugar intake; monitor diet    . Increase physical activity (pt-stated)     Walk more as tolerated        Other   . Obtain Annual Eye (retinal)  Exam      Consider lasik surgery        This is a list of the screening recommended for you and due dates:  Health Maintenance  Topic Date Due  . Colon Cancer Screening  04/24/1998  . Flu Shot  06/18/2019  . Tetanus Vaccine  01/07/2026  .  Hepatitis C: One time screening is recommended by Center for Disease Control  (CDC) for  adults born from [birth year] through 1963.   Completed  . Pneumonia vaccines  Completed

## 2019-03-23 NOTE — Assessment & Plan Note (Signed)
Patient does report having seen dermatology recently for several skin lesions and was advised that they were nothing to worry about.

## 2019-03-23 NOTE — Progress Notes (Signed)
Pt wanted to do a colon guard but was advised that he can't due to family Hx of colon cancer. Last sigmoidoscopy Jan or Feb 2000.

## 2019-03-23 NOTE — Assessment & Plan Note (Signed)
Uncontrolled.  We will increase his amlodipine.  He will return in 1 month for BP check and lab work.

## 2019-03-23 NOTE — Progress Notes (Addendum)
Virtual Visit via telephone Note  This visit type was conducted due to national recommendations for restrictions regarding the COVID-19 pandemic (e.g. social distancing).  This format is felt to be most appropriate for this patient at this time.  All issues noted in this document were discussed and addressed.  No physical exam was performed (except for noted visual exam findings with Video Visits).   I connected with Henry Skinner today at  2:15 PM EDT by telephone and verified that I am speaking with the correct person using two identifiers. Location patient: home Location provider: work or home office Persons participating in the virtual visit: patient, provider  I discussed the limitations, risks, security and privacy concerns of performing an evaluation and management service by telephone and the availability of in person appointments. I also discussed with the patient that there may be a patient responsible charge related to this service. The patient expressed understanding and agreed to proceed.  Interactive audio and video telecommunications were attempted between this provider and patient, however failed, due to patient having technical difficulties OR patient did not have access to video capability.  We continued and completed visit with audio only.   Reason for visit: Follow-up.  HPI: Hypertension: Has been running around 150 over 27s.  Taking amlodipine.  No chest pain, shortness of breath, or edema.  Prediabetes: No polyuria seen.  He has drastically worked on his diet and has decreased his sugar and intake by 50%.  He has been walking as well for exercise.  He notes he is down about 10 pounds after having had bronchitis and has been able to keep it off.  He has decreased his beer intake to 3-4 beers per week.  Bronchitis: Patient notes this progressively improved.  He has had no additional cough.  He feels well.  ROS: See pertinent positives and negatives per HPI.  Past  Medical History:  Diagnosis Date  . Chickenpox   . Kidney stones   . Skin irritation     Past Surgical History:  Procedure Laterality Date  . PAROTIDECTOMY Left 03/04/2016   Procedure: PAROTIDECTOMY;  Surgeon: Beverly Gust, MD;  Location: [facility] ORS;  Service: ENT;  Laterality: Left;  . ROTATOR CUFF REPAIR  May 2016  . SKIN SURGERY     L hand, L arm/shoulder     Family History  Problem Relation Age of Onset  . Alcoholism Brother   . Colon cancer Maternal Uncle     SOCIAL HX: nonsmoker.   Current Outpatient Medications:  .  amLODipine (NORVASC) 10 MG tablet, Take 1 tablet (10 mg total) by mouth daily., Disp: 90 tablet, Rfl: 1 .  b complex vitamins tablet, Take 1 tablet by mouth daily., Disp: , Rfl:  .  TURMERIC PO, Take 1 tablet by mouth at bedtime., Disp: , Rfl:  .  VITAMIN D, CHOLECALCIFEROL, PO, Take 2,000 Units by mouth., Disp: , Rfl:   EXAM: This was a telehealth telephone and no physical exam was completed.  ASSESSMENT AND PLAN:  Discussed the following assessment and plan:  Essential hypertension - Plan: Lipid panel, Comp Met (CMET), amLODipine (NORVASC) 10 MG tablet  Bronchitis  Prediabetes - Plan: Hemoglobin A1c  Family history of colon cancer - Plan: Ambulatory referral to Gastroenterology  Actinic keratoses  Essential hypertension Uncontrolled.  We will increase his amlodipine.  He will return in 1 month for BP check and lab work.  Bronchitis Resolved.  Actinic keratoses Patient does report having seen dermatology recently for several  skin lesions and was advised that they were nothing to worry about.  Prediabetes Continue diet and exercise.  Check lab work in 1 month.    I discussed the assessment and treatment plan with the patient. The patient was provided an opportunity to ask questions and all were answered. The patient agreed with the plan and demonstrated an understanding of the instructions.   The patient was advised to call back or  seek an in-person evaluation if the symptoms worsen or if the condition fails to improve as anticipated.  I provided 19 minutes of non-face-to-face time during this encounter.   Tommi Rumps, MD

## 2019-03-23 NOTE — Progress Notes (Signed)
Subjective:   Henry Skinner is a 73 y.o. male who presents for Medicare Annual/Subsequent preventive examination.  Review of Systems:  No ROS.  Medicare Wellness Virtual Visit.  Visual/audio telehealth visit, UTA vital signs.   See social history for additional risk factors.   Cardiac Risk Factors include: advanced age (>67men, >74 women);male gender;hypertension     Objective:    Vitals: There were no vitals taken for this visit.  There is no height or weight on file to calculate BMI.  Advanced Directives 03/23/2019 03/19/2018 03/18/2017 02/25/2016  Does Patient Have a Medical Advance Directive? No Yes No Yes  Type of Advance Directive - Living will;Healthcare Power of [place];Living will  Does patient want to make changes to medical advance directive? - No - Patient declined - -  Copy of [place] in Chart? - No - copy requested - -  Would patient like information on creating a medical advance directive? No - Patient declined - Yes (MAU/Ambulatory/Procedural Areas - Information given) -    Tobacco Social History   Tobacco Use  Smoking Status Never Smoker  Smokeless Tobacco Never Used     Counseling given: Not Answered   Clinical Intake:  Pre-visit preparation completed: Yes           How often do you need to have someone help you when you read instructions, pamphlets, or other written materials from your doctor or pharmacy?: 1 - Never  Interpreter Needed?: No     Past Medical History:  Diagnosis Date  . Chickenpox   . Kidney stones   . Skin irritation    Past Surgical History:  Procedure Laterality Date  . PAROTIDECTOMY Left 03/04/2016   Procedure: PAROTIDECTOMY;  Surgeon: Beverly Gust, MD;  Location: [facility] ORS;  Service: ENT;  Laterality: Left;  . ROTATOR CUFF REPAIR  May 2016  . SKIN SURGERY     L hand, L arm/shoulder    Family History  Problem Relation Age of Onset  . Alcoholism Brother   . Colon  cancer Maternal Uncle    Social History   Socioeconomic History  . Marital status: Single    Spouse name: Not on file  . Number of children: Not on file  . Years of education: Not on file  . Highest education level: Not on file  Occupational History  . Not on file  Social Needs  . Financial resource strain: Not hard at all  . Food insecurity:    Worry: Never true    Inability: Never true  . Transportation needs:    Medical: No    Non-medical: No  Tobacco Use  . Smoking status: Never Smoker  . Smokeless tobacco: Never Used  Substance and Sexual Activity  . Alcohol use: Yes    Alcohol/week: 1.0 standard drinks    Types: 1 Standard drinks or equivalent per week    Comment: 3-4 beers a week  . Drug use: Yes    Types: Marijuana    Comment: marijuana, rarely  . Sexual activity: Yes  Lifestyle  . Physical activity:    Days per week: 4 days    Minutes per session: 30 min  . Stress: Not at all  Relationships  . Social connections:    Talks on phone: Not on file    Gets together: Not on file    Attends religious service: Not on file    Active member of club or organization: Not on file  Attends meetings of clubs or organizations: Not on file    Relationship status: Not on file  Other Topics Concern  . Not on file  Social History Narrative  . Not on file    Outpatient Encounter Medications as of 03/23/2019  Medication Sig  . amLODipine (NORVASC) 10 MG tablet Take 1 tablet (10 mg total) by mouth daily.  Marland Kitchen b complex vitamins tablet Take 1 tablet by mouth daily.  . TURMERIC PO Take 1 tablet by mouth at bedtime.  Marland Kitchen VITAMIN D, CHOLECALCIFEROL, PO Take 2,000 Units by mouth.   No facility-administered encounter medications on file as of 03/23/2019.     Activities of Daily Living In your present state of health, do you have any difficulty performing the following activities: 03/23/2019  Hearing? N  Vision? N  Difficulty concentrating or making decisions? N  Walking or  climbing stairs? N  Dressing or bathing? N  Doing errands, shopping? N  Preparing Food and eating ? N  Using the Toilet? N  In the past six months, have you accidently leaked urine? N  Do you have problems with loss of bowel control? N  Managing your Medications? N  Managing your Finances? N  Housekeeping or managing your Housekeeping? N  Some recent data might be hidden    Patient Care Team: Leone Haven, MD as PCP - General (Family Medicine)   Assessment:   This is a routine wellness examination for Henry Skinner.  I connected with patient 03/23/19 at  3:00 PM EDT by audio enabled telemedicine application and verified that I am speaking with the correct person using two identifiers. Patient stated full name and DOB. Patient gave permission to continue with virtual visit. Patient's location was at home and Nurse's location was at [place] office.   Health Screenings  Colonoscopy - discussed. Glaucoma -none Hearing -demonstrates normal hearing during visit. Labs followed by pcp.  Dental- UTD Vision- discussed. He plans to scheduled.  Social  Alcohol intake - yes, 3-4 beers a week      Smoking history- never    Smokers in home? brother Illicit drug use? Marijuana, rarely  Exercise - walking every other day, 30 minutes Diet - regular; eating less sugar Sexually Active -yes BMI- discussed the importance of a healthy diet, water intake and the benefits of aerobic exercise.  Educational material provided.   Safety  Patient feels safe at home- yes Patient does have smoke detectors at home- yes Patient does wear sunscreen or protective clothing when in direct sunlight -yes Patient does wear seat belt when in a moving vehicle -yes  Activities of Daily Living Patient needs no assistance doing their own household chores. Denies needing assistance with: driving, feeding themselves, getting from bed to chair, getting to the toilet, bathing/showering, dressing, managing money, or  preparing meals.  No new identified risk were noted.    Depression Screen Patient denies losing interest in daily life, feeling hopeless, or crying easily over simple problems.   Medication-taking as directed and without issues.   Fall Screen Patient denies being afraid of falling or falling in the last year.   Memory Screen Patient is alert. Correctly identified the president of the [place] and recall of 2/3 objects. Patient likes to play sodoku puzzles for brain stimulation.  Immunizations The following Immunizations were discussed: Influenza, shingles, pneumonia, and tetanus.   Other Providers Patient Care Team: Leone Haven, MD as PCP - General (Family Medicine)  Exercise Activities and Dietary recommendations Current Exercise Habits: Home  exercise routine, Type of exercise: walking, Time (Minutes): 30, Intensity: Mild  Goals      Patient Stated   . DIET - REDUCE SUGAR INTAKE (pt-stated)     Continue reducing sugar intake; monitor diet    . Increase physical activity (pt-stated)     Walk more as tolerated        Other   . Obtain Annual Eye (retinal)  Exam      Consider lasik surgery        Fall Risk Fall Risk  03/23/2019 03/19/2018 12/24/2017 10/30/2016  Falls in the past year? 0 No No No   Depression Screen PHQ 2/9 Scores 03/23/2019 03/19/2018 12/24/2017 03/18/2017  PHQ - 2 Score 0 0 0 0  PHQ- 9 Score - - - 0    Cognitive Function MMSE - Mini Mental State Exam 03/19/2018 03/18/2017  Orientation to time 5 5  Orientation to Place 5 5  Registration 3 3  Attention/ Calculation 5 5  Recall 3 3  Language- name 2 objects 2 2  Language- repeat 1 1  Language- follow 3 step command 3 3  Language- read & follow direction 1 1  Write a sentence 1 1  Copy design 1 1  Total score 30 30     6CIT Screen 03/23/2019  What Year? 0 points  What month? 0 points  What time? 0 points  Count back from 20 0 points  Months in reverse 0 points  Repeat phrase 0 points  Total Score  0    Immunization History  Administered Date(s) Administered  . Influenza, High Dose Seasonal PF 08/09/2018  . Influenza-Unspecified 10/20/2016  . Pneumococcal Conjugate-13 12/25/2016  . Pneumococcal Polysaccharide-23 03/19/2018  . Tdap 01/08/2016  . Zoster 12/18/2006   Screening Tests Health Maintenance  Topic Date Due  . COLONOSCOPY  04/24/1998  . INFLUENZA VACCINE  06/18/2019  . TETANUS/TDAP  01/07/2026  . Hepatitis C Screening  Completed  . PNA vac Low Risk Adult  Completed       Plan:    End of life planning; Advance aging; Advanced directives discussed.  Copy of current HCPOA/Living Will requested upon completion.    Covid-19 precautions discussed.   I have personally reviewed and noted the following in the patient's chart:   . Medical and social history . Use of alcohol, tobacco or illicit drugs  . Current medications and supplements . Functional ability and status . Nutritional status . Physical activity . Advanced directives . List of other physicians . Hospitalizations, surgeries, and ER visits in previous 12 months . Vitals . Screenings to include cognitive, depression, and falls . Referrals and appointments  In addition, I have reviewed and discussed with patient certain preventive protocols, quality metrics, and best practice recommendations. A written personalized care plan for preventive services as well as general preventive health recommendations were provided to patient.     Varney Biles, LPN  07/20/8181

## 2019-03-23 NOTE — Telephone Encounter (Signed)
Please contact the patient and get him set up for lab work and a nurse visit for a BP check in 1 month.  Orders have been placed.  He should follow-up with me in the office in 4 months.  Thanks.

## 2019-03-26 NOTE — Progress Notes (Signed)
I have reviewed the above note and agree.  Eric Sonnenberg, M.D.  

## 2019-04-19 ENCOUNTER — Ambulatory Visit (INDEPENDENT_AMBULATORY_CARE_PROVIDER_SITE_OTHER): Payer: PPO

## 2019-04-19 ENCOUNTER — Other Ambulatory Visit: Payer: Self-pay

## 2019-04-19 ENCOUNTER — Other Ambulatory Visit (INDEPENDENT_AMBULATORY_CARE_PROVIDER_SITE_OTHER): Payer: PPO

## 2019-04-19 VITALS — BP 150/70 | HR 63

## 2019-04-19 DIAGNOSIS — I10 Essential (primary) hypertension: Secondary | ICD-10-CM | POA: Diagnosis not present

## 2019-04-19 DIAGNOSIS — R7303 Prediabetes: Secondary | ICD-10-CM | POA: Diagnosis not present

## 2019-04-19 LAB — LIPID PANEL
Cholesterol: 172 mg/dL (ref 0–200)
HDL: 57.4 mg/dL (ref >39.00)
LDL Cholesterol: 101 mg/dL — ABNORMAL HIGH (ref 0–99)
NonHDL: 114.74
Total CHOL/HDL Ratio: 3
Triglycerides: 67 mg/dL (ref 0.0–149.0)
VLDL: 13.4 mg/dL (ref 0.0–40.0)

## 2019-04-19 LAB — COMPREHENSIVE METABOLIC PANEL
ALT: 19 U/L (ref 0–53)
AST: 17 U/L (ref 0–37)
Albumin: 4.2 g/dL (ref 3.5–5.2)
Alkaline Phosphatase: 64 U/L (ref 39–117)
BUN: 17 mg/dL (ref 6–23)
CO2: 32 mEq/L (ref 19–32)
Calcium: 9.7 mg/dL (ref 8.4–10.5)
Chloride: 102 mEq/L (ref 96–112)
Creatinine, Ser: 0.94 mg/dL (ref 0.40–1.50)
GFR: 79.12 mL/min (ref >60.00)
Glucose, Bld: 100 mg/dL — ABNORMAL HIGH (ref 70–99)
Potassium: 4.3 mEq/L (ref 3.5–5.1)
Sodium: 139 mEq/L (ref 135–145)
Total Bilirubin: 0.7 mg/dL (ref 0.2–1.2)
Total Protein: 7 g/dL (ref 6.0–8.3)

## 2019-04-19 LAB — HEMOGLOBIN A1C: Hgb A1c MFr Bld: 5.8 % (ref 4.6–6.5)

## 2019-04-19 NOTE — Progress Notes (Addendum)
Patient here for nurse visit BP check per order from Dr. Caryl Bis   Patient reports compliance with prescribed BP medications: yes  Last dose of BP medication:   BP Readings from Last 3 Encounters:  12/15/18 140/86  11/29/18 (!) 148/80  05/21/18 136/78   Pulse Readings from Last 3 Encounters:  12/15/18 99  11/29/18 70  05/21/18 82   Gordy Councilman, CMA

## 2019-04-19 NOTE — Progress Notes (Signed)
BP appears stable today. Patient having no acute symptoms and was sent home after BP check. Will send this note to PCP for any medication adjustments  LGuse FNP

## 2019-04-24 NOTE — Progress Notes (Signed)
Patient's blood pressure is above goal.  I would suggest we add an additional medication called HCTZ to his regimen.  Once you speak with him I can send this to his pharmacy.  He would need lab work 1 month after starting this.  Thanks.

## 2019-04-26 NOTE — Progress Notes (Signed)
Mailbox is full could not leave a message.  Nina,cma

## 2019-04-27 NOTE — Progress Notes (Signed)
Mailbox is full could not leave a message I have called 3 times can you send a letter he is not on mychart.    Christerpher Clos,cma

## 2019-04-28 NOTE — Progress Notes (Signed)
Letter created. Please print and send to the patient.

## 2019-04-29 NOTE — Progress Notes (Signed)
Letter mailed to patient today.  Tarrie Mcmichen,cma

## 2019-05-12 ENCOUNTER — Encounter: Payer: Self-pay | Admitting: *Deleted

## 2019-05-13 ENCOUNTER — Telehealth: Payer: Self-pay | Admitting: *Deleted

## 2019-05-13 NOTE — Telephone Encounter (Signed)
Patient came in and a letter had been mailed to his home with labs, So I printed the letter and gave to the patient.  Deran Barro,cma

## 2019-05-13 NOTE — Telephone Encounter (Signed)
Copied from [place] [id number]. Topic: General - Other >> May 12, 2019  2:50 PM Keene Breath wrote: Reason for CRM: Patient called for lab results and decided that he was going to come into the office to ask for a printout of his labs.  Patient does not have a computer and is not familiar with My Chart and said he was not able to use a computer.  Please advise.

## 2019-05-24 ENCOUNTER — Encounter: Payer: Self-pay | Admitting: *Deleted

## 2019-05-31 DIAGNOSIS — L570 Actinic keratosis: Secondary | ICD-10-CM | POA: Diagnosis not present

## 2019-05-31 DIAGNOSIS — Z08 Encounter for follow-up examination after completed treatment for malignant neoplasm: Secondary | ICD-10-CM | POA: Diagnosis not present

## 2019-05-31 DIAGNOSIS — C44612 Basal cell carcinoma of skin of right upper limb, including shoulder: Secondary | ICD-10-CM | POA: Diagnosis not present

## 2019-05-31 DIAGNOSIS — L821 Other seborrheic keratosis: Secondary | ICD-10-CM | POA: Diagnosis not present

## 2019-05-31 DIAGNOSIS — D485 Neoplasm of uncertain behavior of skin: Secondary | ICD-10-CM | POA: Diagnosis not present

## 2019-05-31 DIAGNOSIS — C44619 Basal cell carcinoma of skin of left upper limb, including shoulder: Secondary | ICD-10-CM | POA: Diagnosis not present

## 2019-05-31 DIAGNOSIS — Z85828 Personal history of other malignant neoplasm of skin: Secondary | ICD-10-CM | POA: Diagnosis not present

## 2019-05-31 DIAGNOSIS — X32XXXA Exposure to sunlight, initial encounter: Secondary | ICD-10-CM | POA: Diagnosis not present

## 2019-06-09 DIAGNOSIS — C44619 Basal cell carcinoma of skin of left upper limb, including shoulder: Secondary | ICD-10-CM | POA: Diagnosis not present

## 2019-06-09 DIAGNOSIS — X32XXXA Exposure to sunlight, initial encounter: Secondary | ICD-10-CM | POA: Diagnosis not present

## 2019-06-09 DIAGNOSIS — C44612 Basal cell carcinoma of skin of right upper limb, including shoulder: Secondary | ICD-10-CM | POA: Diagnosis not present

## 2019-06-09 DIAGNOSIS — L570 Actinic keratosis: Secondary | ICD-10-CM | POA: Diagnosis not present

## 2019-07-19 ENCOUNTER — Other Ambulatory Visit: Payer: Self-pay

## 2019-07-19 ENCOUNTER — Telehealth: Payer: Self-pay | Admitting: Gastroenterology

## 2019-07-19 DIAGNOSIS — Z800 Family history of malignant neoplasm of digestive organs: Secondary | ICD-10-CM

## 2019-07-19 DIAGNOSIS — Z1211 Encounter for screening for malignant neoplasm of colon: Secondary | ICD-10-CM

## 2019-07-19 NOTE — Telephone Encounter (Signed)
PT LEFT VM TO SCHEDULE A COLONOSCOPY

## 2019-07-19 NOTE — Telephone Encounter (Signed)
Gastroenterology Pre-Procedure Review  Request Date: 07/28/19 Requesting Physician: Dr. Vicente Males  PATIENT REVIEW QUESTIONS: The patient responded to the following health history questions as indicated:    1. Are you having any GI issues? no 2. Do you have a personal history of Polyps? no 3. Do you have a family history of Colon Cancer or Polyps? yes (maternal uncle colon cancer) 4. Diabetes Mellitus? no 5. Joint replacements in the past 12 months?no 6. Major health problems in the past 3 months?no 7. Any artificial heart valves, MVP, or defibrillator?no    MEDICATIONS & ALLERGIES:    Patient reports the following regarding taking any anticoagulation/antiplatelet therapy:   Plavix, Coumadin, Eliquis, Xarelto, Lovenox, Pradaxa, Brilinta, or Effient? no Aspirin? no  Patient confirms/reports the following medications:  Current Outpatient Medications  Medication Sig Dispense Refill  . amLODipine (NORVASC) 10 MG tablet Take 1 tablet (10 mg total) by mouth daily. 90 tablet 1  . b complex vitamins tablet Take 1 tablet by mouth daily.    . TURMERIC PO Take 1 tablet by mouth at bedtime.    Marland Kitchen VITAMIN D, CHOLECALCIFEROL, PO Take 2,000 Units by mouth.     No current facility-administered medications for this visit.     Patient confirms/reports the following allergies:  No Known Allergies  No orders of the defined types were placed in this encounter.   AUTHORIZATION INFORMATION Primary Insurance: 1D#: Group #:  Secondary Insurance: 1D#: Group #:  SCHEDULE INFORMATION: Date: 07/28/19 Time: Location:[facility]

## 2019-07-26 ENCOUNTER — Other Ambulatory Visit: Admission: RE | Admit: 2019-07-26 | Payer: PPO | Source: Ambulatory Visit

## 2019-07-26 ENCOUNTER — Telehealth: Payer: Self-pay | Admitting: Gastroenterology

## 2019-07-26 NOTE — Telephone Encounter (Signed)
Pt left vm to reschedule his procedure for the end of September

## 2019-07-26 NOTE — Telephone Encounter (Signed)
Returned patients call to cancel colonoscopy 07/28/2019 with Dr. Vicente Males due to busy schedule. He will call back to schedule.   Thanks,  Sharyn Lull

## 2019-07-27 ENCOUNTER — Ambulatory Visit: Payer: PPO | Admitting: Family Medicine

## 2019-07-27 DIAGNOSIS — Z0289 Encounter for other administrative examinations: Secondary | ICD-10-CM

## 2019-07-28 ENCOUNTER — Encounter: Admission: RE | Payer: Self-pay | Source: Home / Self Care

## 2019-07-28 ENCOUNTER — Ambulatory Visit: Admission: RE | Admit: 2019-07-28 | Payer: PPO | Source: Home / Self Care | Admitting: Gastroenterology

## 2019-07-28 SURGERY — COLONOSCOPY WITH PROPOFOL
Anesthesia: General

## 2019-08-09 ENCOUNTER — Telehealth: Payer: Self-pay | Admitting: Gastroenterology

## 2019-08-09 NOTE — Telephone Encounter (Signed)
LVM returning patients call to schedule his colonoscopy.  Asked him to call the office back, and informed him that I'm working from home so the number will be restricted when I return his call again.  Thanks [employer]

## 2019-08-09 NOTE — Telephone Encounter (Signed)
Pt left vm to speak to Sharyn Lull to r/s his colonoscopy

## 2019-08-11 ENCOUNTER — Telehealth: Payer: Self-pay | Admitting: Gastroenterology

## 2019-08-11 ENCOUNTER — Other Ambulatory Visit: Payer: Self-pay

## 2019-08-11 DIAGNOSIS — Z1211 Encounter for screening for malignant neoplasm of colon: Secondary | ICD-10-CM

## 2019-08-11 MED ORDER — NA SULFATE-K SULFATE-MG SULF 17.5-3.13-1.6 GM/177ML PO SOLN: 1.0000 | Freq: Once | ORAL | 0 refills | Status: DC

## 2019-08-11 MED ORDER — PEG 3350-KCL-NA BICARB-NACL 420 G PO SOLR: 4000.0000 mL | Freq: Once | ORAL | 0 refills | Status: AC

## 2019-08-11 NOTE — Telephone Encounter (Signed)
Pt left vm he spoke with Sharyn Lull today and needs to speak to her again please call pt

## 2019-08-11 NOTE — Telephone Encounter (Signed)
Bowel prep has been changed to Trylitely or Golytely discussed with patient.  Thanks [employer]

## 2019-08-22 ENCOUNTER — Other Ambulatory Visit: Payer: Self-pay

## 2019-08-24 ENCOUNTER — Encounter: Payer: Self-pay | Admitting: Family Medicine

## 2019-08-24 ENCOUNTER — Other Ambulatory Visit: Payer: Self-pay

## 2019-08-24 ENCOUNTER — Ambulatory Visit (INDEPENDENT_AMBULATORY_CARE_PROVIDER_SITE_OTHER): Payer: PPO | Admitting: Family Medicine

## 2019-08-24 DIAGNOSIS — A059 Bacterial foodborne intoxication, unspecified: Secondary | ICD-10-CM | POA: Diagnosis not present

## 2019-08-24 DIAGNOSIS — L989 Disorder of the skin and subcutaneous tissue, unspecified: Secondary | ICD-10-CM | POA: Diagnosis not present

## 2019-08-24 DIAGNOSIS — Z87442 Personal history of urinary calculi: Secondary | ICD-10-CM

## 2019-08-24 DIAGNOSIS — I10 Essential (primary) hypertension: Secondary | ICD-10-CM | POA: Diagnosis not present

## 2019-08-24 DIAGNOSIS — R1031 Right lower quadrant pain: Secondary | ICD-10-CM | POA: Diagnosis not present

## 2019-08-24 DIAGNOSIS — R109 Unspecified abdominal pain: Secondary | ICD-10-CM | POA: Insufficient documentation

## 2019-08-24 NOTE — Assessment & Plan Note (Signed)
Undetermined cause.  He had no significant symptoms.  He has improved.

## 2019-08-24 NOTE — Patient Instructions (Signed)
Nice to see you. Please monitor for any recurrence of the abdominal pain and if it does recur please let us know. Please see your dermatologist as planned for the skin lesion on your lip.  If it starts to enlarge or changes in any manner please call them prior to your scheduled follow-up.

## 2019-08-24 NOTE — Progress Notes (Signed)
Tommi Rumps, MD Phone: [phone]  Henry Skinner is a 73 y.o. male who presents today for f/u.  HYPERTENSION  Disease Monitoring  Home BP Monitoring not checking Chest pain- no    Dyspnea- no Medications  Compliance-  Taking amlodipine.  Edema- no  History of kidney stones: No recent issues with this.  No hematuria.  No dysuria.  Abdominal pain: Patient notes on 2 occasions he had some right lower abdominal discomfort that lasted about 3 hours at a time.  It occurred after eating the same thing.  He ate blue chips and had a beer.  It was the size of a finger tip in his right lower quadrant.  He noted no diarrhea, nausea, or vomiting with this.  It has not recurred in the last 2 weeks.  Food poisoning: Patient reports he ate some outdated macaroni and cheese from his food co-op.  He notes his stomach felt flip floppy after this and he had a little bit of chills with it.  This was 6 to 7 weeks ago.  He had no vomiting or diarrhea.  He notes numerous other people had similar symptoms after eating macaroni and cheese.  Skin lesion: Patient follows with dermatology for skin checks and history of skin cancer.  He has had a skin colored papule come up on his left upper lip.  It has enlarged slightly.  He forgot to have his dermatologist look at it previously.    Social History   Tobacco Use  Smoking Status Never Smoker  Smokeless Tobacco Never Used     ROS see history of present illness  Objective  Physical Exam Vitals:   08/24/19 1449  BP: 120/70  Pulse: 83  Temp: 98.3 F (36.8 C)  SpO2: 98%    BP Readings from Last 3 Encounters:  08/24/19 120/70  04/19/19 (!) 150/70  12/15/18 140/86   Wt Readings from Last 3 Encounters:  08/24/19 163 lb 9.6 oz (74.2 kg)  12/15/18 168 lb 6.4 oz (76.4 kg)  11/29/18 171 lb 6.4 oz (77.7 kg)    Physical Exam Constitutional:      General: He is not in acute distress.    Appearance: He is not diaphoretic.  HENT:     Head:    Cardiovascular:     Rate and Rhythm: Normal rate and regular rhythm.     Heart sounds: Normal heart sounds.  Pulmonary:     Effort: Pulmonary effort is normal.     Breath sounds: Normal breath sounds.  Abdominal:     General: Bowel sounds are normal. There is no distension.     Palpations: Abdomen is soft.     Tenderness: There is no abdominal tenderness. There is no guarding or rebound.  Musculoskeletal:     Right lower leg: No edema.     Left lower leg: No edema.  Skin:    General: Skin is warm and dry.  Neurological:     Mental Status: He is alert.      Assessment/Plan: Please see individual problem list.  Essential hypertension Well-controlled.  Continue current regimen.  Skin lesion Benign appearing.  I advised him to have his dermatologist look at this to consider removal given its location.  History of kidney stones No recurrence.  He will monitor.  Food poisoning Undetermined cause.  He had no significant symptoms.  He has improved.  Abdominal pain Possibly related to the food he was eating.  Very focal and self-limited.  No recurrence in the last  2 weeks.  Benign exam.  If it recurs he will let us know.   No orders of the defined types were placed in this encounter.   No orders of the defined types were placed in this encounter.    Tommi Rumps, MD [place]

## 2019-08-24 NOTE — Assessment & Plan Note (Signed)
Benign appearing.  I advised him to have his dermatologist look at this to consider removal given its location.

## 2019-08-24 NOTE — Assessment & Plan Note (Signed)
Well-controlled.  Continue current regimen. 

## 2019-08-24 NOTE — Assessment & Plan Note (Signed)
Possibly related to the food he was eating.  Very focal and self-limited.  No recurrence in the last 2 weeks.  Benign exam.  If it recurs he will let us know.

## 2019-08-24 NOTE — Assessment & Plan Note (Signed)
No recurrence.  He will monitor. 

## 2019-08-26 ENCOUNTER — Other Ambulatory Visit
Admission: RE | Admit: 2019-08-26 | Discharge: 2019-08-26 | Disposition: A | Discharge status: Home / Self Care | Payer: PPO | Source: Ambulatory Visit | Attending: Gastroenterology | Admitting: Gastroenterology

## 2019-08-26 DIAGNOSIS — Z01812 Encounter for preprocedural laboratory examination: Secondary | ICD-10-CM | POA: Insufficient documentation

## 2019-08-26 DIAGNOSIS — Z20828 Contact with and (suspected) exposure to other viral communicable diseases: Secondary | ICD-10-CM | POA: Insufficient documentation

## 2019-08-26 LAB — SARS CORONAVIRUS 2 (TAT 6-24 HRS): SARS Coronavirus 2: NEGATIVE

## 2019-08-29 ENCOUNTER — Encounter: Payer: Self-pay | Admitting: *Deleted

## 2019-08-29 ENCOUNTER — Telehealth: Payer: Self-pay | Admitting: Gastroenterology

## 2019-08-29 NOTE — Telephone Encounter (Signed)
Patient called in & wanted to know his Covid results he had done on Friday 08-26-19. I spoke with Ginger & she confirmed the results was negative. I called & spoke with patient & confirmed his results was negative.

## 2019-08-30 ENCOUNTER — Other Ambulatory Visit: Payer: Self-pay

## 2019-08-30 ENCOUNTER — Encounter
Admission: RE | Disposition: A | Discharge status: Home / Self Care | Payer: Self-pay | Source: Home / Self Care | Attending: Gastroenterology

## 2019-08-30 ENCOUNTER — Ambulatory Visit
Admission: RE | Admit: 2019-08-30 | Discharge: 2019-08-30 | Disposition: A | Discharge status: Home / Self Care | Payer: PPO | Attending: Gastroenterology | Admitting: Gastroenterology

## 2019-08-30 ENCOUNTER — Ambulatory Visit: Payer: PPO | Admitting: Anesthesiology

## 2019-08-30 DIAGNOSIS — K635 Polyp of colon: Secondary | ICD-10-CM | POA: Diagnosis not present

## 2019-08-30 DIAGNOSIS — Z1211 Encounter for screening for malignant neoplasm of colon: Secondary | ICD-10-CM

## 2019-08-30 DIAGNOSIS — I10 Essential (primary) hypertension: Secondary | ICD-10-CM | POA: Diagnosis not present

## 2019-08-30 DIAGNOSIS — D122 Benign neoplasm of ascending colon: Secondary | ICD-10-CM | POA: Diagnosis not present

## 2019-08-30 DIAGNOSIS — D126 Benign neoplasm of colon, unspecified: Secondary | ICD-10-CM | POA: Diagnosis not present

## 2019-08-30 DIAGNOSIS — Z79899 Other long term (current) drug therapy: Secondary | ICD-10-CM | POA: Insufficient documentation

## 2019-08-30 DIAGNOSIS — D125 Benign neoplasm of sigmoid colon: Secondary | ICD-10-CM | POA: Diagnosis not present

## 2019-08-30 HISTORY — PX: COLONOSCOPY WITH PROPOFOL: SHX5780

## 2019-08-30 SURGERY — COLONOSCOPY WITH PROPOFOL
Anesthesia: General

## 2019-08-30 MED ORDER — PROPOFOL 10 MG/ML IV BOLUS
INTRAVENOUS | Status: DC | PRN
  Administered 2019-08-30 (×3): 10 mg via INTRAVENOUS

## 2019-08-30 MED ORDER — PROPOFOL 500 MG/50ML IV EMUL
INTRAVENOUS | Status: DC | PRN
  Administered 2019-08-30: 175 ug/kg/min via INTRAVENOUS

## 2019-08-30 MED ORDER — LIDOCAINE HCL (CARDIAC) PF 100 MG/5ML IV SOSY
PREFILLED_SYRINGE | INTRAVENOUS | Status: DC | PRN
  Administered 2019-08-30: 30 mg via INTRATRACHEAL

## 2019-08-30 MED ORDER — DEXMEDETOMIDINE HCL 200 MCG/2ML IV SOLN
INTRAVENOUS | Status: DC | PRN
  Administered 2019-08-30: 8 ug via INTRAVENOUS

## 2019-08-30 MED ORDER — SODIUM CHLORIDE 0.9 % IV SOLN
INTRAVENOUS | Status: DC
  Administered 2019-08-30: 1000 mL via INTRAVENOUS

## 2019-08-30 NOTE — Anesthesia Postprocedure Evaluation (Signed)
Anesthesia Post Note  Patient: Henry Skinner  Procedure(s) Performed: COLONOSCOPY WITH PROPOFOL (N/A )  Patient location during evaluation: Endoscopy Anesthesia Type: General Level of consciousness: awake and alert and oriented Pain management: pain level controlled Vital Signs Assessment: post-procedure vital signs reviewed and stable Respiratory status: spontaneous breathing, nonlabored ventilation and respiratory function stable Cardiovascular status: blood pressure returned to baseline and stable Postop Assessment: no signs of nausea or vomiting Anesthetic complications: no     Last Vitals:  Vitals:   08/30/19 1157 08/30/19 1207  BP: 109/65   Pulse: (!) 59 66  Resp: 17 12  Temp:    SpO2: 98% 98%    Last Pain:  Vitals:   08/30/19 1157  TempSrc:   PainSc: 0-No pain                 Caedin Mogan

## 2019-08-30 NOTE — Anesthesia Post-op Follow-up Note (Signed)
Anesthesia QCDR form completed.        

## 2019-08-30 NOTE — Transfer of Care (Signed)
Immediate Anesthesia Transfer of Care Note  Patient: Henry Skinner  Procedure(s) Performed: COLONOSCOPY WITH PROPOFOL (N/A )  Patient Location: Endoscopy Unit  Anesthesia Type:General  Level of Consciousness: awake and alert   Airway & Oxygen Therapy: Patient Spontanous Breathing  Post-op Assessment: Report given to RN and Post -op Vital signs reviewed and stable  Post vital signs: Reviewed and stable  Last Vitals:  Vitals Value Taken Time  BP 103/54 08/30/19 1137  Temp 36.6 C 08/30/19 1137  Pulse 65 08/30/19 1137  Resp 17 08/30/19 1137  SpO2 96 % 08/30/19 1137    Last Pain:  Vitals:   08/30/19 1137  TempSrc: Tympanic  PainSc: 0-No pain         Complications: No apparent anesthesia complications

## 2019-08-30 NOTE — H&P (Signed)
Jonathon Bellows, MD [address] [place], [place], [place], [address] [address], [place], [place], [place], [address] Phone: [phone]  Fax: [phone]  Primary Care Physician:  Leone Haven, MD   Pre-Procedure History & Physical: HPI:  Henry Skinner is a 73 y.o. male is here for an colonoscopy.   Past Medical History:  Diagnosis Date  . Chickenpox   . Kidney stones   . Skin irritation     Past Surgical History:  Procedure Laterality Date  . PAROTIDECTOMY Left 03/04/2016   Procedure: PAROTIDECTOMY;  Surgeon: Beverly Gust, MD;  Location: [facility] ORS;  Service: ENT;  Laterality: Left;  . ROTATOR CUFF REPAIR  May 2016  . SKIN SURGERY     L hand, L arm/shoulder     Prior to Admission medications   Medication Sig Start Date End Date Taking? Authorizing Provider  amLODipine (NORVASC) 10 MG tablet Take 1 tablet (10 mg total) by mouth daily. 03/23/19  Yes Leone Haven, MD  b complex vitamins tablet Take 1 tablet by mouth daily.   Yes [provider]  Omega-3 Fatty Acids (FISH OIL) 500 MG CAPS Take by mouth.   Yes [provider]  TURMERIC PO Take 1 tablet by mouth at bedtime.   Yes [provider]  VITAMIN D, CHOLECALCIFEROL, PO Take 2,000 Units by mouth.   Yes [provider]  vitamin E (VITAMIN E) 1000 UNIT capsule Take 1,000 Units by mouth daily.   Yes [provider]    Allergies as of 08/11/2019  . (No Known Allergies)    Family History  Problem Relation Age of Onset  . Alcoholism Brother   . Colon cancer Maternal Uncle     Social History   Socioeconomic History  . Marital status: Single    Spouse name: Not on file  . Number of children: Not on file  . Years of education: Not on file  . Highest education level: Not on file  Occupational History  . Not on file  Social Needs  . Financial resource strain: Not hard at all  . Food insecurity    Worry: Never true    Inability: Never true  .  Transportation needs    Medical: No    Non-medical: No  Tobacco Use  . Smoking status: Never Smoker  . Smokeless tobacco: Never Used  Substance and Sexual Activity  . Alcohol use: Yes    Alcohol/week: 1.0 standard drinks    Types: 1 Standard drinks or equivalent per week    Comment: 3-4 beers a week  . Drug use: Yes    Types: Marijuana    Comment: marijuana, rarely  . Sexual activity: Yes  Lifestyle  . Physical activity    Days per week: 4 days    Minutes per session: 30 min  . Stress: Not at all  Relationships  . Social Herbalist on phone: Not on file    Gets together: Not on file    Attends religious service: Not on file    Active member of club or organization: Not on file    Attends meetings of clubs or organizations: Not on file    Relationship status: Not on file  . Intimate partner violence    Fear of current or ex partner: Not on file    Emotionally abused: Not on file    Physically abused: Not on file    Forced sexual activity: Not on file  Other Topics Concern  . Not on file  Social History Narrative  . Not on file    Review of Systems: See HPI, otherwise negative ROS  Physical Exam: BP (!) 141/89   Pulse 81   Temp (!) 97.3 F (36.3 C) (Tympanic)   Resp 20   Ht 5\' 7"  (1.702 m)   Wt 72.6 kg   SpO2 100%   BMI 25.06 kg/m  General:   Alert,  pleasant and cooperative in NAD Head:  Normocephalic and atraumatic. Neck:  Supple; no masses or thyromegaly. Lungs:  Clear throughout to auscultation, normal respiratory effort.    Heart:  +S1, +S2, Regular rate and rhythm, No edema. Abdomen:  Soft, nontender and nondistended. Normal bowel sounds, without guarding, and without rebound.   Neurologic:  Alert and  oriented x4;  grossly normal neurologically.  Impression/Plan: Henry Skinner is here for an colonoscopy to be performed for Screening colonoscopy average risk   Risks, benefits, limitations, and alternatives regarding  colonoscopy have been  reviewed with the patient.  Questions have been answered.  All parties agreeable.   Jonathon Bellows, MD  08/30/2019, 10:46 AM

## 2019-08-30 NOTE — Anesthesia Preprocedure Evaluation (Signed)
Anesthesia Evaluation  Patient identified by MRN, date of birth, ID band Patient awake    Reviewed: Allergy & Precautions, NPO status , Patient's Chart, lab work & pertinent test results  History of Anesthesia Complications Negative for: history of anesthetic complications  Airway Mallampati: II  TM Distance: >3 FB Neck ROM: Full    Dental no notable dental hx.    Pulmonary neg pulmonary ROS, neg sleep apnea, neg COPD,    breath sounds clear to auscultation- rhonchi (-) wheezing      Cardiovascular hypertension, (-) CAD, (-) Past MI, (-) Cardiac Stents and (-) CABG  Rhythm:Regular Rate:Normal - Systolic murmurs and - Diastolic murmurs    Neuro/Psych neg Seizures negative neurological ROS  negative psych ROS   GI/Hepatic negative GI ROS, Neg liver ROS,   Endo/Other  negative endocrine ROSneg diabetes  Renal/GU Renal disease: hx of nephrolithiasis.     Musculoskeletal negative musculoskeletal ROS (+)   Abdominal (+) - obese,   Peds  Hematology negative hematology ROS (+)   Anesthesia Other Findings Past Medical History: No date: Chickenpox No date: Kidney stones No date: Skin irritation   Reproductive/Obstetrics                             Anesthesia Physical Anesthesia Plan  ASA: II  Anesthesia Plan: General   Post-op Pain Management:    Induction: Intravenous  PONV Risk Score and Plan: 1 and Propofol infusion  Airway Management Planned: Natural Airway  Additional Equipment:   Intra-op Plan:   Post-operative Plan:   Informed Consent: I have reviewed the patients History and Physical, chart, labs and discussed the procedure including the risks, benefits and alternatives for the proposed anesthesia with the patient or authorized representative who has indicated his/her understanding and acceptance.     Dental advisory given  Plan Discussed with: CRNA and  Anesthesiologist  Anesthesia Plan Comments:         Anesthesia Quick Evaluation

## 2019-08-30 NOTE — Op Note (Signed)
[facility] Patient Name: Henry Skinner Procedure Date: 08/30/2019 10:54 AM MRN: [id number] Account #: [id number] Date of Birth: 1946/02/11 Admit Type: Outpatient Age: 73 Room: [facility] ENDO ROOM 1 Gender: Male Note Status: Finalized Procedure:            Colonoscopy Indications:          Screening for colorectal malignant neoplasm Providers:            Jonathon Bellows MD, MD Referring MD:         Angela Adam. Caryl Bis (Referring MD) Medicines:            Monitored Anesthesia Care Complications:        No immediate complications. Procedure:            Pre-Anesthesia Assessment:                       - Prior to the procedure, a History and Physical was                        performed, and patient medications, allergies and                        sensitivities were reviewed. The patient's tolerance of                        previous anesthesia was reviewed.                       - The risks and benefits of the procedure and the                        sedation options and risks were discussed with the                        patient. All questions were answered and informed                        consent was obtained.                       - ASA Grade Assessment: II - A patient with mild                        systemic disease.                       After obtaining informed consent, the colonoscope was                        passed under direct vision. Throughout the procedure,                        the patient's blood pressure, pulse, and oxygen                        saturations were monitored continuously. The                        Colonoscope was introduced through the anus and  advanced to the the cecum, identified by the                        appendiceal orifice. The colonoscopy was performed with                        ease. The patient tolerated the procedure well. The                        quality of the bowel preparation was  good. Findings:      The perianal and digital rectal examinations were normal.      Two sessile polyps were found in the sigmoid colon and ascending colon.       The polyps were 3 to 5 mm in size. These polyps were removed with a cold       biopsy forceps. Resection and retrieval were complete.      The exam was otherwise without abnormality on direct and retroflexion       views. Impression:           - Two 3 to 5 mm polyps in the sigmoid colon and in the                        ascending colon, removed with a cold biopsy forceps.                        Resected and retrieved.                       - The examination was otherwise normal on direct and                        retroflexion views. Recommendation:       - Discharge patient to home (with escort).                       - Resume previous diet.                       - Continue present medications.                       - Await pathology results.                       - Repeat colonoscopy for surveillance based on                        pathology results. Procedure Code(s):    --- Professional ---                       [phone], Colonoscopy, flexible; with biopsy, single or                        multiple Diagnosis Code(s):    --- Professional ---                       Z12.11, Encounter for screening for malignant neoplasm                        of colon  K63.5, Polyp of colon CPT copyright 2019 American Medical Association. All rights reserved. The codes documented in this report are preliminary and upon coder review may  be revised to meet current compliance requirements. Jonathon Bellows, MD Jonathon Bellows MD, MD 08/30/2019 11:33:14 AM This report has been signed electronically. Number of Addenda: 0 Note Initiated On: 08/30/2019 10:54 AM Scope Withdrawal Time: 0 hours 18 minutes 15 seconds  Total Procedure Duration: 0 hours 20 minutes 12 seconds  Estimated Blood Loss: Estimated blood loss: none.      [facility]

## 2019-08-31 ENCOUNTER — Encounter: Payer: Self-pay | Admitting: Gastroenterology

## 2019-08-31 LAB — SURGICAL PATHOLOGY

## 2019-09-06 ENCOUNTER — Telehealth: Payer: Self-pay

## 2019-09-06 NOTE — Telephone Encounter (Signed)
-----   Message from Jonathon Bellows, MD sent at 09/04/2019  9:21 PM EDT ----- Sherald Hess inform both [polyps were pre cancerous, since age > 26 do not recommend further colonoscopy for screening purposes .  C/c Leone Haven, MD   Dr Jonathon Bellows MD,MRCP [facility]) Gastroenterology/Hepatology Pager: [phone]

## 2019-09-06 NOTE — Telephone Encounter (Signed)
Spoke with pt and informed him of biopsy results. 

## 2019-09-09 ENCOUNTER — Other Ambulatory Visit: Payer: Self-pay

## 2019-09-12 ENCOUNTER — Other Ambulatory Visit: Payer: Self-pay | Admitting: Family Medicine

## 2019-09-12 DIAGNOSIS — I10 Essential (primary) hypertension: Secondary | ICD-10-CM

## 2019-12-13 DIAGNOSIS — R208 Other disturbances of skin sensation: Secondary | ICD-10-CM | POA: Diagnosis not present

## 2019-12-13 DIAGNOSIS — Z85828 Personal history of other malignant neoplasm of skin: Secondary | ICD-10-CM | POA: Diagnosis not present

## 2019-12-13 DIAGNOSIS — X32XXXA Exposure to sunlight, initial encounter: Secondary | ICD-10-CM | POA: Diagnosis not present

## 2019-12-13 DIAGNOSIS — D0461 Carcinoma in situ of skin of right upper limb, including shoulder: Secondary | ICD-10-CM | POA: Diagnosis not present

## 2019-12-13 DIAGNOSIS — D485 Neoplasm of uncertain behavior of skin: Secondary | ICD-10-CM | POA: Diagnosis not present

## 2019-12-13 DIAGNOSIS — D230 Other benign neoplasm of skin of lip: Secondary | ICD-10-CM | POA: Diagnosis not present

## 2019-12-13 DIAGNOSIS — D2271 Melanocytic nevi of right lower limb, including hip: Secondary | ICD-10-CM | POA: Diagnosis not present

## 2019-12-13 DIAGNOSIS — L570 Actinic keratosis: Secondary | ICD-10-CM | POA: Diagnosis not present

## 2019-12-13 DIAGNOSIS — L821 Other seborrheic keratosis: Secondary | ICD-10-CM | POA: Diagnosis not present

## 2019-12-13 DIAGNOSIS — D2272 Melanocytic nevi of left lower limb, including hip: Secondary | ICD-10-CM | POA: Diagnosis not present

## 2020-01-24 DIAGNOSIS — D0461 Carcinoma in situ of skin of right upper limb, including shoulder: Secondary | ICD-10-CM | POA: Diagnosis not present

## 2020-01-24 DIAGNOSIS — L570 Actinic keratosis: Secondary | ICD-10-CM | POA: Diagnosis not present

## 2020-01-24 DIAGNOSIS — D230 Other benign neoplasm of skin of lip: Secondary | ICD-10-CM | POA: Diagnosis not present

## 2020-01-24 DIAGNOSIS — X32XXXA Exposure to sunlight, initial encounter: Secondary | ICD-10-CM | POA: Diagnosis not present

## 2020-02-01 ENCOUNTER — Other Ambulatory Visit: Payer: Self-pay

## 2020-02-01 ENCOUNTER — Ambulatory Visit
Admission: EM | Admit: 2020-02-01 | Discharge: 2020-02-01 | Disposition: A | Discharge status: Home / Self Care | Payer: PPO | Attending: Emergency Medicine | Admitting: Emergency Medicine

## 2020-02-01 DIAGNOSIS — R21 Rash and other nonspecific skin eruption: Secondary | ICD-10-CM | POA: Diagnosis not present

## 2020-02-01 DIAGNOSIS — R509 Fever, unspecified: Secondary | ICD-10-CM

## 2020-02-01 DIAGNOSIS — R5383 Other fatigue: Secondary | ICD-10-CM | POA: Diagnosis not present

## 2020-02-01 DIAGNOSIS — W57XXXA Bitten or stung by nonvenomous insect and other nonvenomous arthropods, initial encounter: Secondary | ICD-10-CM

## 2020-02-01 DIAGNOSIS — S30860D Insect bite (nonvenomous) of lower back and pelvis, subsequent encounter: Secondary | ICD-10-CM | POA: Diagnosis not present

## 2020-02-01 MED ORDER — DOXYCYCLINE HYCLATE 100 MG PO CAPS: 100.0000 mg | ORAL_CAPSULE | Freq: Two times a day (BID) | ORAL | 0 refills | Status: DC

## 2020-02-01 NOTE — ED Provider Notes (Signed)
Henry Skinner    CSN: [id number] Arrival date & time: 02/01/20  1335      History   Chief Complaint Chief Complaint  Patient presents with  . Tick Bite    HPI [name] is a 74 y.o. male.   Patient presents with fever, rash, myalgias, arthralgias, fatigue, and headache x3 days. Tmax 100 today.  He had a tick bite on his right side below his axilla on 01/25/2020; the tick was not engorged; he was able to remove the entire tick and saved it.  He denies sore throat, shortness of breath, abdominal pain, nausea, vomiting, diarrhea, or other symptoms.  No treatments attempted at home.     The history is provided by the patient.    Past Medical History:  Diagnosis Date  . Chickenpox   . Kidney stones   . Skin irritation     Patient Active Problem List   Diagnosis Date Noted  . Skin lesion 08/24/2019  . Food poisoning 08/24/2019  . Abdominal pain 08/24/2019  . Prediabetes 03/23/2019  . Urinary urgency 02/17/2018  . Family history of colon cancer 02/17/2018  . Essential hypertension 01/06/2017  . Skin cancer 01/06/2017  . Actinic keratoses 07/03/2016  . Parotid mass 02/01/2016  . Overweight 01/08/2016  . History of kidney stones 01/08/2016    Past Surgical History:  Procedure Laterality Date  . COLONOSCOPY WITH PROPOFOL N/A 08/30/2019   Procedure: COLONOSCOPY WITH PROPOFOL;  Surgeon: Jonathon Bellows, MD;  Location: [facility] ENDOSCOPY;  Service: Gastroenterology;  Laterality: N/A;  . PAROTIDECTOMY Left 03/04/2016   Procedure: PAROTIDECTOMY;  Surgeon: Beverly Gust, MD;  Location: [facility] ORS;  Service: ENT;  Laterality: Left;  . ROTATOR CUFF REPAIR  May 2016  . SKIN SURGERY     L hand, L arm/shoulder        Home Medications    Prior to Admission medications   Medication Sig Start Date End Date Taking? Authorizing Provider  amLODipine (NORVASC) 10 MG tablet TAKE 1 TABLET BY MOUTH EVERY DAY 09/12/19  Yes Leone Haven, MD  b complex vitamins tablet Take 1  tablet by mouth daily.   Yes [provider]  Omega-3 Fatty Acids (FISH OIL) 500 MG CAPS Take by mouth.   Yes [provider]  TURMERIC PO Take 1 tablet by mouth at bedtime.   Yes [provider]  VITAMIN D, CHOLECALCIFEROL, PO Take 2,000 Units by mouth.   Yes [provider]  vitamin E (VITAMIN E) 1000 UNIT capsule Take 1,000 Units by mouth daily.   Yes [provider]  doxycycline (VIBRAMYCIN) 100 MG capsule Take 1 capsule (100 mg total) by mouth 2 (two) times daily. 02/01/20   Sharion Balloon, NP    Family History Family History  Problem Relation Age of Onset  . Alcoholism Brother   . Colon cancer Maternal Uncle     Social History Social History   Tobacco Use  . Smoking status: Never Smoker  . Smokeless tobacco: Never Used  Substance Use Topics  . Alcohol use: Yes    Alcohol/week: 1.0 standard drinks    Types: 1 Standard drinks or equivalent per week    Comment: 3-4 beers a week  . Drug use: Yes    Types: Marijuana    Comment: marijuana, rarely     Allergies   Patient has no known allergies.   Review of Systems Review of Systems  Constitutional: Positive for fatigue and fever. Negative for chills.  HENT: Negative for  ear pain and sore throat.   Eyes: Negative for pain and visual disturbance.  Respiratory: Negative for cough and shortness of breath.   Cardiovascular: Negative for chest pain and palpitations.  Gastrointestinal: Negative for abdominal pain, diarrhea, nausea and vomiting.  Genitourinary: Negative for dysuria and hematuria.  Musculoskeletal: Positive for arthralgias and myalgias. Negative for back pain.  Skin: Positive for rash and wound. Negative for color change.  Neurological: Positive for headaches. Negative for dizziness, seizures, syncope, weakness and numbness.  All other systems reviewed and are negative.    Physical Exam Triage Vital Signs ED Triage Vitals  Enc Vitals Group     BP      Pulse       Resp      Temp      Temp src      SpO2      Weight      Height      Head Circumference      Peak Flow      Pain Score      Pain Loc      Pain Edu?      Excl. in [place]?    No data found.  Updated Vital Signs BP 120/69 (BP Location: Right Arm)   Pulse 75   Temp 100 F (37.8 C) (Oral)   Resp 18   Ht 5\' 8"  (1.727 m)   Wt 162 lb (73.5 kg)   SpO2 96%   BMI 24.63 kg/m   Visual Acuity Right Eye Distance:   Left Eye Distance:   Bilateral Distance:    Right Eye Near:   Left Eye Near:    Bilateral Near:     Physical Exam Vitals and nursing note reviewed.  Constitutional:      General: He is not in acute distress.    Appearance: He is well-developed. He is not ill-appearing.  HENT:     Head: Normocephalic and atraumatic.     Mouth/Throat:     Mouth: Mucous membranes are moist.     Pharynx: Oropharynx is clear.  Eyes:     Conjunctiva/sclera: Conjunctivae normal.  Cardiovascular:     Rate and Rhythm: Normal rate and regular rhythm.     Heart sounds: No murmur.  Pulmonary:     Effort: Pulmonary effort is normal. No respiratory distress.     Breath sounds: Normal breath sounds.  Abdominal:     General: Bowel sounds are normal.     Palpations: Abdomen is soft.     Tenderness: There is no abdominal tenderness. There is no guarding or rebound.  Musculoskeletal:        General: No swelling or deformity. Normal range of motion.     Cervical back: Neck supple.  Skin:    General: Skin is warm and dry.     Findings: Lesion and rash present.     Comments: Right side below axilla:  3 cm erythematous area with central healing lesion; no drainage.  Erythematous patchy rash noted from below tick bite to waist.   Neurological:     General: No focal deficit present.     Mental Status: He is alert and oriented to person, place, and time.     Sensory: No sensory deficit.     Motor: No weakness.     Gait: Gait normal.  Psychiatric:        Mood and Affect: Mood normal.         Behavior: Behavior normal.  UC Treatments / Results  Labs (all labs ordered are listed, but only abnormal results are displayed) [place] MTN SPOTTED FVR ABS PNL(IGG+IGM)    EKG   Radiology No results found.  Procedures Procedures (including critical care time)  Medications Ordered in UC Medications - No data to display  Initial Impression / Assessment and Plan / UC Course  I have reviewed the triage vital signs and the nursing notes.  Pertinent labs & imaging results that were available during my care of the patient were reviewed by me and considered in my medical decision making (see chart for details).   Tick bite, initial encounter.  Treating with doxycycline x10 days.  RMSF and Lyme blood work drawn.  Instructed patient to call his PCP to schedule an appointment for follow-up in 1 week.  Instructed him to follow-up sooner or return here if his symptoms worsen.  Patient agrees to plan of care.      Final Clinical Impressions(s) / UC Diagnoses   Final diagnoses:  Tick bite, initial encounter     Discharge Instructions     Take the doxycycline as directed.    Your tick bite blood work is pending.    Call your primary care provider to schedule a follow up appointment in 1 week.      ED Prescriptions    Medication Sig Dispense Auth. Provider   doxycycline (VIBRAMYCIN) 100 MG capsule Take 1 capsule (100 mg total) by mouth 2 (two) times daily. 20 capsule Sharion Balloon, NP     PDMP not reviewed this encounter.   Sharion Balloon, NP 02/01/20 1436

## 2020-02-01 NOTE — Discharge Instructions (Addendum)
Take the doxycycline as directed.    Your tick bite blood work is pending.    Call your primary care provider to schedule a follow up appointment in 1 week.

## 2020-02-01 NOTE — ED Triage Notes (Addendum)
Patient states that he was bit by a tick that he noticed on 03/10. States that he pulled this off on 03/10 off his back. Patient states that on Monday morning he stated noticing upper thigh pain, then the next day the pain radiated to his knees bilateral. Patient states that yesterday he started noticing the pain in his lower leg. States that this morning he woke up wet all over his body. Patient states that leg pain has been continuous. Patient states that he has also noticed arm pain and a headache since Monday.

## 2020-02-06 LAB — RMSF, IGG, IFA: RMSF, IGG, IFA: <1:64 {titer}

## 2020-02-06 LAB — ROCKY MTN SPOTTED FVR ABS PNL(IGG+IGM)
RMSF IgG: POSITIVE — AB
RMSF IgM: 0.25 index (ref 0.00–0.89)

## 2020-02-06 LAB — B. BURGDORFI ANTIBODIES: Lyme IgG/IgM Ab: <0.91 {ISR} (ref 0.00–0.90)

## 2020-02-24 ENCOUNTER — Ambulatory Visit (INDEPENDENT_AMBULATORY_CARE_PROVIDER_SITE_OTHER): Payer: PPO | Admitting: Family Medicine

## 2020-02-24 ENCOUNTER — Other Ambulatory Visit: Payer: Self-pay

## 2020-02-24 ENCOUNTER — Encounter: Payer: Self-pay | Admitting: Family Medicine

## 2020-02-24 VITALS — BP 138/79 | HR 90 | Temp 96.6°F | Ht 67.0 in | Wt 168.1 lb

## 2020-02-24 DIAGNOSIS — R7303 Prediabetes: Secondary | ICD-10-CM

## 2020-02-24 DIAGNOSIS — L989 Disorder of the skin and subcutaneous tissue, unspecified: Secondary | ICD-10-CM

## 2020-02-24 DIAGNOSIS — W57XXXD Bitten or stung by nonvenomous insect and other nonvenomous arthropods, subsequent encounter: Secondary | ICD-10-CM | POA: Diagnosis not present

## 2020-02-24 DIAGNOSIS — I10 Essential (primary) hypertension: Secondary | ICD-10-CM | POA: Diagnosis not present

## 2020-02-24 DIAGNOSIS — W57XXXA Bitten or stung by nonvenomous insect and other nonvenomous arthropods, initial encounter: Secondary | ICD-10-CM | POA: Insufficient documentation

## 2020-02-24 DIAGNOSIS — S30860A Insect bite (nonvenomous) of lower back and pelvis, initial encounter: Secondary | ICD-10-CM | POA: Insufficient documentation

## 2020-02-24 DIAGNOSIS — S30860D Insect bite (nonvenomous) of lower back and pelvis, subsequent encounter: Secondary | ICD-10-CM

## 2020-02-24 NOTE — Assessment & Plan Note (Addendum)
Adequate control for age.  Continue current regimen.  He will continue adequate fluid intake to prevent lightheadedness.  Check labs.

## 2020-02-24 NOTE — Patient Instructions (Signed)
Nice to see you. We will get labs and contact you with the results.

## 2020-02-24 NOTE — Assessment & Plan Note (Signed)
He will continue to see dermatology. 

## 2020-02-24 NOTE — Assessment & Plan Note (Signed)
Check A1c. 

## 2020-02-24 NOTE — Assessment & Plan Note (Signed)
Doing well.  He will try to avoid ticks.

## 2020-02-24 NOTE — Progress Notes (Signed)
  Tommi Rumps, MD Phone: [phone]  Henry Skinner is a 74 y.o. male who presents today for f/u.  HYPERTENSION  Disease Monitoring  Home BP Monitoring 120/69 at urgent care Chest pain- no    Dyspnea- no Medications  Compliance-  Taking amlodipine. Lightheadedness-  2x this year, didn't drink enough water  Edema- no  Tick bite: Notes he had this on his right flank in March.  He got the tick off right away.  There is itching.  He has body aching and a low-grade fever with this.  They placed him on doxycycline.  His Lyme disease testing was negative.  [facility] spotted fever screening test was positive though the follow-up test was negative.  He completed the doxycycline and has had no recurrent symptoms.  Skin lesions: Patient follows with dermatology.  He is to go back to have additional areas removed from his area just inferior to his nose on his face.    Social History   Tobacco Use  Smoking Status Never Smoker  Smokeless Tobacco Never Used     ROS see history of present illness  Objective  Physical Exam Vitals:   02/24/20 1415  BP: 138/79  Pulse: 90  Temp: (!) 96.6 F (35.9 C)  SpO2: 97%    BP Readings from Last 3 Encounters:  02/24/20 138/79  02/01/20 120/69  08/30/19 109/65   Wt Readings from Last 3 Encounters:  02/24/20 168 lb 1.9 oz (76.3 kg)  02/01/20 162 lb (73.5 kg)  08/30/19 160 lb (72.6 kg)    Physical Exam Constitutional:      General: He is not in acute distress.    Appearance: He is not diaphoretic.  Cardiovascular:     Rate and Rhythm: Normal rate and regular rhythm.     Heart sounds: Normal heart sounds.  Pulmonary:     Effort: Pulmonary effort is normal.     Breath sounds: Normal breath sounds.  Musculoskeletal:     Right lower leg: No edema.     Left lower leg: No edema.  Skin:    General: Skin is warm and dry.          Comments: Well-healing  area over right flank  Neurological:     Mental Status: He is alert.       Assessment/Plan: Please see individual problem list.  Essential hypertension Adequate control for age.  Continue current regimen.  He will continue adequate fluid intake to prevent lightheadedness.  Check labs.  Skin lesion He will continue to see dermatology.  Prediabetes Check A1c.  Tick bite of back Doing well.  He will try to avoid ticks.   Orders Placed This Encounter  Procedures  . Comp Met (CMET)  . Direct LDL  . HgB A1c    No orders of the defined types were placed in this encounter.   This visit occurred during the SARS-CoV-2 public health emergency.  Safety protocols were in place, including screening questions prior to the visit, additional usage of staff PPE, and extensive cleaning of exam room while observing appropriate contact time as indicated for disinfecting solutions.    Tommi Rumps, MD [facility] [facility]

## 2020-02-25 LAB — COMPREHENSIVE METABOLIC PANEL
AG Ratio: 1.6 (calc) (ref 1.0–2.5)
ALT: 22 U/L (ref 9–46)
AST: 22 U/L (ref 10–35)
Albumin: 4.2 g/dL (ref 3.6–5.1)
Alkaline phosphatase (APISO): 71 U/L (ref 35–144)
BUN: 17 mg/dL (ref 7–25)
CO2: 27 mmol/L (ref 20–32)
Calcium: 9.3 mg/dL (ref 8.6–10.3)
Chloride: 105 mmol/L (ref 98–110)
Creat: 0.95 mg/dL (ref 0.70–1.18)
Globulin: 2.7 g/dL (calc) (ref 1.9–3.7)
Glucose, Bld: 101 mg/dL — ABNORMAL HIGH (ref 65–99)
Potassium: 4 mmol/L (ref 3.5–5.3)
Sodium: 140 mmol/L (ref 135–146)
Total Bilirubin: 0.8 mg/dL (ref 0.2–1.2)
Total Protein: 6.9 g/dL (ref 6.1–8.1)

## 2020-02-25 LAB — HEMOGLOBIN A1C
Hgb A1c MFr Bld: 5.6 % of total Hgb (ref <5.7)
Mean Plasma Glucose: 114 (calc)
eAG (mmol/L): 6.3 (calc)

## 2020-02-25 LAB — LDL CHOLESTEROL, DIRECT: Direct LDL: 83 mg/dL (ref <100)

## 2020-02-27 ENCOUNTER — Encounter: Payer: Self-pay | Admitting: Family Medicine

## 2020-03-16 ENCOUNTER — Other Ambulatory Visit: Payer: Self-pay | Admitting: Family Medicine

## 2020-03-16 DIAGNOSIS — I10 Essential (primary) hypertension: Secondary | ICD-10-CM

## 2020-03-23 ENCOUNTER — Ambulatory Visit (INDEPENDENT_AMBULATORY_CARE_PROVIDER_SITE_OTHER): Payer: PPO

## 2020-03-23 ENCOUNTER — Ambulatory Visit: Payer: PPO

## 2020-03-23 ENCOUNTER — Encounter (INDEPENDENT_AMBULATORY_CARE_PROVIDER_SITE_OTHER): Payer: Self-pay

## 2020-03-23 VITALS — Ht 67.0 in | Wt 165.0 lb

## 2020-03-23 DIAGNOSIS — Z0000 Encounter for general adult medical examination without abnormal findings: Secondary | ICD-10-CM

## 2020-03-23 NOTE — Progress Notes (Signed)
Subjective:   Henry Skinner is a 74 y.o. male who presents for Medicare Annual/Subsequent preventive examination.  Review of Systems:  No ROS.  Medicare Wellness Virtual Visit.  Visual/audio telehealth visit, UTA vital signs.   Wt/Ht provided.  See social history for additional risk factors.   Cardiac Risk Factors include: advanced age (>36men, >44 women);male gender;hypertension     Objective:    Vitals: Ht 5\' 7"  (1.702 m)   Wt 165 lb (74.8 kg)   BMI 25.84 kg/m   Body mass index is 25.84 kg/m.  Advanced Directives 03/23/2020 02/01/2020 08/30/2019 03/23/2019 03/19/2018 03/18/2017 02/25/2016  Does Patient Have a Medical Advance Directive? No No Yes No Yes No Yes  Type of Advance Directive - - Living will - Living will;Healthcare Power of [place];Living will  Does patient want to make changes to medical advance directive? - - - - No - Patient declined - -  Copy of [place] in Chart? - - - - No - copy requested - -  Would patient like information on creating a medical advance directive? Yes (MAU/Ambulatory/Procedural Areas - Information given) - - No - Patient declined - Yes (MAU/Ambulatory/Procedural Areas - Information given) -    Tobacco Social History   Tobacco Use  Smoking Status Never Smoker  Smokeless Tobacco Never Used     Counseling given: Not Answered   Clinical Intake:  Pre-visit preparation completed: Yes        Diabetes: No  How often do you need to have someone help you when you read instructions, pamphlets, or other written materials from your doctor or pharmacy?: 1 - Never  Interpreter Needed?: No     Past Medical History:  Diagnosis Date  . Chickenpox   . Kidney stones   . Skin irritation    Past Surgical History:  Procedure Laterality Date  . COLONOSCOPY WITH PROPOFOL N/A 08/30/2019   Procedure: COLONOSCOPY WITH PROPOFOL;  Surgeon: Jonathon Bellows, MD;  Location: [facility] ENDOSCOPY;  Service:  Gastroenterology;  Laterality: N/A;  . PAROTIDECTOMY Left 03/04/2016   Procedure: PAROTIDECTOMY;  Surgeon: Beverly Gust, MD;  Location: [facility] ORS;  Service: ENT;  Laterality: Left;  . ROTATOR CUFF REPAIR  May 2016  . SKIN SURGERY     L hand, L arm/shoulder    Family History  Problem Relation Age of Onset  . Alcoholism Brother   . Colon cancer Maternal Uncle    Social History   Socioeconomic History  . Marital status: Single    Spouse name: Not on file  . Number of children: Not on file  . Years of education: Not on file  . Highest education level: Not on file  Occupational History  . Not on file  Tobacco Use  . Smoking status: Never Smoker  . Smokeless tobacco: Never Used  Substance and Sexual Activity  . Alcohol use: Yes    Alcohol/week: 1.0 standard drinks    Types: 1 Standard drinks or equivalent per week    Comment: 1 beer daily  . Drug use: Yes    Types: Marijuana    Comment: marijuana, rarely. Last use 11/2018  . Sexual activity: Yes  Other Topics Concern  . Not on file  Social History Narrative  . Not on file   Social Determinants of Health   Financial Resource Strain:   . Difficulty of Paying Living Expenses:   Food Insecurity:   . Worried About Charity fundraiser in the Last Year:   .  Ran Out of Food in the Last Year:   Transportation Needs:   . Film/video editor (Medical):   Marland Kitchen Lack of Transportation (Non-Medical):   Physical Activity:   . Days of Exercise per Week:   . Minutes of Exercise per Session:   Stress:   . Feeling of Stress :   Social Connections:   . Frequency of Communication with Friends and Family:   . Frequency of Social Gatherings with Friends and Family:   . Attends Religious Services:   . Active Member of Clubs or Organizations:   . Attends Archivist Meetings:   Marland Kitchen Marital Status:     Outpatient Encounter Medications as of 03/23/2020  Medication Sig  . amLODipine (NORVASC) 10 MG tablet TAKE 1 TABLET BY MOUTH  EVERY DAY  . b complex vitamins tablet Take 1 tablet by mouth daily.  . Omega-3 Fatty Acids (FISH OIL) 500 MG CAPS Take by mouth.  . TURMERIC PO Take 1 tablet by mouth at bedtime.  Marland Kitchen VITAMIN D, CHOLECALCIFEROL, PO Take 2,000 Units by mouth.  . vitamin E (VITAMIN E) 1000 UNIT capsule Take 1,000 Units by mouth daily.   No facility-administered encounter medications on file as of 03/23/2020.    Activities of Daily Living In your present state of health, do you have any difficulty performing the following activities: 03/23/2020  Hearing? N  Vision? N  Difficulty concentrating or making decisions? N  Walking or climbing stairs? N  Dressing or bathing? N  Doing errands, shopping? N  Preparing Food and eating ? N  Using the Toilet? N  In the past six months, have you accidently leaked urine? N  Do you have problems with loss of bowel control? N  Managing your Medications? N  Managing your Finances? N  Housekeeping or managing your Housekeeping? N  Some recent data might be hidden    Patient Care Team: Leone Haven, MD as PCP - General (Family Medicine)   Assessment:   This is a routine wellness examination for Henry Skinner.  Nurse connected with patient 03/23/20 at  1:30 PM EDT by a telephone enabled telemedicine application and verified that I am speaking with the correct person using two identifiers. Patient stated full name and DOB. Patient gave permission to continue with virtual visit. Patient's location was at home and Nurse's location was at [place] office.   Patient is alert and oriented x3. Patient denies difficulty focusing or concentrating. Patient likes to complete sodoku, puzzles and plays jeopardy for brain health.   Health Maintenance Due: -Covid vaccine- deferred per patient request See completed HM at the end of note.   Eye: Visual acuity not assessed. Virtual visit. Followed by their ophthalmologist.  Dental: Visits every 6 months.    Hearing: Demonstrates  normal hearing during visit.  Safety:  Patient feels safe at home- yes Patient does have smoke detectors at home- yes Patient does wear sunscreen or protective clothing when in direct sunlight - yes Patient does wear seat belt when in a moving vehicle - yes Patient drives- yes Adequate lighting in walkways free from debris- yes Grab bars and handrails used as appropriate- yes Ambulates with an assistive device- no Cell phone on person when ambulating outside of the home- yes  Social: Alcohol intake - yes      Smoking history- never  Smokers in home? No. Brother smokes outside the house. Illicit drug use? Not currently. Last used 11/2018.  Medication: Taking as directed and without issues.  Self  managed - yes   Covid-19: Precautions and sickness symptoms discussed. Wears mask, social distancing, hand hygiene as appropriate.   Activities of Daily Living Patient denies needing assistance with: household chores, feeding themselves, getting from bed to chair, getting to the toilet, bathing/showering, dressing, managing money, or preparing meals.   Discussed the importance of a healthy diet, water intake and the benefits of aerobic exercise.  Physical activity- walking as often as possible about 30 minutes, brisk. Yard work.   Diet:  Regular Water: good intake Caffeine: 3 rectangle squares of dark chocolate, 1.5 cup of coffee daily, 1 cup of tea  Other Providers Patient Care Team: Leone Haven, MD as PCP - General (Family Medicine)  Exercise Activities and Dietary recommendations Current Exercise Habits: Home exercise routine, Type of exercise: walking, Time (Minutes): 30, Frequency (Times/Week): 5, Weekly Exercise (Minutes/Week): 150  Goals      Patient Stated   . DIET - REDUCE SUGAR INTAKE (pt-stated)     Continue reducing sugar intake; monitor diet    . Follow up with PCP as needed (pt-stated)    . Increase physical activity (pt-stated)     Walk more as tolerated          Fall Risk Fall Risk  03/23/2020 02/24/2020 08/24/2019 03/23/2019 03/19/2018  Falls in the past year? 0 0 0 0 No  Number falls in past yr: - 0 0 - -  Follow up Falls evaluation completed Falls evaluation completed Falls evaluation completed - -   Timed Get Up and Go Performed: no, virtual visit  Depression Screen PHQ 2/9 Scores 03/23/2020 02/24/2020 08/24/2019 03/23/2019  PHQ - 2 Score 0 0 0 0  PHQ- 9 Score - - - -    Cognitive Function MMSE - Mini Mental State Exam 03/19/2018 03/18/2017  Orientation to time 5 5  Orientation to Place 5 5  Registration 3 3  Attention/ Calculation 5 5  Recall 3 3  Language- name 2 objects 2 2  Language- repeat 1 1  Language- follow 3 step command 3 3  Language- read & follow direction 1 1  Write a sentence 1 1  Copy design 1 1  Total score 30 30     6CIT Screen 03/23/2020 03/23/2019  What Year? 0 points 0 points  What month? 0 points 0 points  What time? 0 points 0 points  Count back from 20 0 points 0 points  Months in reverse 0 points 0 points  Repeat phrase 0 points 0 points  Total Score 0 0    Immunization History  Administered Date(s) Administered  . Influenza, High Dose Seasonal PF 11/09/2015, 09/10/2016, 08/19/2017, 08/09/2018, 07/05/2019, 07/05/2019  . Influenza,inj,quad, With Preservative 08/22/2014  . Influenza-Unspecified 10/20/2016  . Pneumococcal Conjugate-13 12/25/2016  . Pneumococcal Polysaccharide-23 03/19/2018  . Tdap 01/08/2016  . Zoster 12/18/2006  . Zoster Recombinat (Shingrix) 06/08/2019, 09/23/2019   Screening Tests Health Maintenance  Topic Date Due  . COVID-19 Vaccine (1) 04/08/2020 (Originally 04/24/1962)  . INFLUENZA VACCINE  06/17/2020  . TETANUS/TDAP  01/07/2026  . COLONOSCOPY  08/29/2029  . Hepatitis C Screening  Completed  . PNA vac Low Risk Adult  Completed       Plan:   Keep all routine maintenance appointments.   Follow up 08/29/20 @ 1:15  Medicare Attestation I have personally reviewed: The  patient's medical and social history Their use of alcohol, tobacco or illicit drugs Their current medications and supplements The patient's functional ability including ADLs,fall risks, home safety risks,  cognitive, and hearing and visual impairment Diet and physical activities Evidence for depression   I have reviewed and discussed with patient certain preventive protocols, quality metrics, and best practice recommendations.      Varney Biles, LPN  [phone]

## 2020-03-23 NOTE — Patient Instructions (Addendum)
  [name] , Thank you for taking time to come for your Medicare Wellness Visit. I appreciate your ongoing commitment to your health goals. Please review the following plan we discussed and let me know if I can assist you in the future.   These are the goals we discussed: Goals      Patient Stated   . DIET - REDUCE SUGAR INTAKE (pt-stated)     Continue reducing sugar intake; monitor diet    . Follow up with PCP as needed (pt-stated)    . Increase physical activity (pt-stated)     Walk more as tolerated         This is a list of the screening recommended for you and due dates:  Health Maintenance  Topic Date Due  . COVID-19 Vaccine (1) 04/08/2020*  . Flu Shot  06/17/2020  . Tetanus Vaccine  01/07/2026  . Colon Cancer Screening  08/29/2029  .  Hepatitis C: One time screening is recommended by Center for Disease Control  (CDC) for  adults born from [birth year] through 1963.   Completed  . Pneumonia vaccines  Completed  *Topic was postponed. The date shown is not the original due date.

## 2020-03-23 NOTE — Progress Notes (Signed)
I have reviewed the above note and agree.  Bessye Stith, M.D.  

## 2020-05-29 DIAGNOSIS — L570 Actinic keratosis: Secondary | ICD-10-CM | POA: Diagnosis not present

## 2020-05-29 DIAGNOSIS — D485 Neoplasm of uncertain behavior of skin: Secondary | ICD-10-CM | POA: Diagnosis not present

## 2020-05-29 DIAGNOSIS — L218 Other seborrheic dermatitis: Secondary | ICD-10-CM | POA: Diagnosis not present

## 2020-05-29 DIAGNOSIS — X32XXXA Exposure to sunlight, initial encounter: Secondary | ICD-10-CM | POA: Diagnosis not present

## 2020-05-29 DIAGNOSIS — Z85828 Personal history of other malignant neoplasm of skin: Secondary | ICD-10-CM | POA: Diagnosis not present

## 2020-05-29 DIAGNOSIS — Z08 Encounter for follow-up examination after completed treatment for malignant neoplasm: Secondary | ICD-10-CM | POA: Diagnosis not present

## 2020-05-29 DIAGNOSIS — L821 Other seborrheic keratosis: Secondary | ICD-10-CM | POA: Diagnosis not present

## 2020-05-29 DIAGNOSIS — C44519 Basal cell carcinoma of skin of other part of trunk: Secondary | ICD-10-CM | POA: Diagnosis not present

## 2020-06-26 DIAGNOSIS — C44519 Basal cell carcinoma of skin of other part of trunk: Secondary | ICD-10-CM | POA: Diagnosis not present

## 2020-07-06 ENCOUNTER — Other Ambulatory Visit: Payer: Self-pay

## 2020-07-06 ENCOUNTER — Ambulatory Visit (INDEPENDENT_AMBULATORY_CARE_PROVIDER_SITE_OTHER): Payer: PPO | Admitting: Family Medicine

## 2020-07-06 ENCOUNTER — Encounter: Payer: Self-pay | Admitting: Family Medicine

## 2020-07-06 VITALS — BP 130/80 | HR 78 | Temp 98.4°F | Ht 67.0 in | Wt 169.8 lb

## 2020-07-06 DIAGNOSIS — I10 Essential (primary) hypertension: Secondary | ICD-10-CM

## 2020-07-06 DIAGNOSIS — N401 Enlarged prostate with lower urinary tract symptoms: Secondary | ICD-10-CM

## 2020-07-06 DIAGNOSIS — R350 Frequency of micturition: Secondary | ICD-10-CM

## 2020-07-06 LAB — POCT URINALYSIS DIPSTICK
Bilirubin, UA: NEGATIVE
Blood, UA: NEGATIVE
Glucose, UA: NEGATIVE
Ketones, UA: NEGATIVE
Leukocytes, UA: NEGATIVE
Nitrite, UA: NEGATIVE
Protein, UA: NEGATIVE
Spec Grav, UA: 1.025 (ref 1.010–1.025)
Urobilinogen, UA: 0.2 E.U./dL
pH, UA: 6 (ref 5.0–8.0)

## 2020-07-06 LAB — PSA: PSA: 2.19 ng/mL (ref 0.10–4.00)

## 2020-07-06 NOTE — Assessment & Plan Note (Signed)
Adequately controlled.  Continue amlodipine.

## 2020-07-06 NOTE — Progress Notes (Signed)
  Tommi Rumps, MD Phone: [phone]  Elester Apodaca is a 74 y.o. male who presents today for f/u.  HYPERTENSION  Disease Monitoring  Home BP Monitoring 138/79 Chest pain- no    Dyspnea- no Medications  Compliance-  Taking amlodipine.  Edema- no  BPH: symptoms worsened over the past 5-6 weeks Strain- no Flow- good Frequency- every 2-3 hours Urgency- no Nocturia- 2-3x Dysuria- no Emptying bladder- possibly not emptying fully Medication- none Some improvement in symptoms with stopping alcohol and caffeinated beverages.    Social History   Tobacco Use  Smoking Status Never Smoker  Smokeless Tobacco Never Used     ROS see history of present illness  Objective  Physical Exam Vitals:   07/06/20 0842  BP: 130/80  Pulse: 78  Temp: 98.4 F (36.9 C)  SpO2: 98%    BP Readings from Last 3 Encounters:  07/06/20 130/80  02/24/20 138/79  02/01/20 120/69   Wt Readings from Last 3 Encounters:  07/06/20 169 lb 12.8 oz (77 kg)  03/23/20 165 lb (74.8 kg)  02/24/20 168 lb 1.9 oz (76.3 kg)    Physical Exam Constitutional:      General: He is not in acute distress.    Appearance: He is not diaphoretic.  Cardiovascular:     Rate and Rhythm: Normal rate and regular rhythm.     Heart sounds: Normal heart sounds.  Pulmonary:     Effort: Pulmonary effort is normal.     Breath sounds: Normal breath sounds.  Genitourinary:    Comments: Prostate is enlarged right sided greater than left, no specific nodules palpated, no tenderness Skin:    General: Skin is warm and dry.  Neurological:     Mental Status: He is alert.      Assessment/Plan: Please see individual problem list.  BPH (benign prostatic hyperplasia) Patient with prior issues with urinary urgency and frequency though seems to have worsened recently with increased frequency and nocturia.  Exam reveals enlarged prostate with right side being larger than left.  We will obtain a PSA and for analysis.  Will  refer to urology given right-sided enlargement.  Essential hypertension Adequately controlled.  Continue amlodipine.   Orders Placed This Encounter  Procedures  . PSA  . Ambulatory referral to Urology    Referral Priority:   Routine    Referral Type:   Consultation    Referral Reason:   Specialty Services Required    Requested Specialty:   Urology    Number of Visits Requested:   1  . POCT Urinalysis Dipstick    No orders of the defined types were placed in this encounter.   This visit occurred during the SARS-CoV-2 public health emergency.  Safety protocols were in place, including screening questions prior to the visit, additional usage of staff PPE, and extensive cleaning of exam room while observing appropriate contact time as indicated for disinfecting solutions.    Tommi Rumps, MD [place]

## 2020-07-06 NOTE — Assessment & Plan Note (Signed)
Patient with prior issues with urinary urgency and frequency though seems to have worsened recently with increased frequency and nocturia.  Exam reveals enlarged prostate with right side being larger than left.  We will obtain a PSA and for analysis.  Will refer to urology given right-sided enlargement.

## 2020-07-06 NOTE — Patient Instructions (Signed)
Nice to see you. We will get labs today and check your urine. I am referring you to urology for your prostate.

## 2020-07-11 NOTE — Progress Notes (Signed)
07/12/2020 2:40 PM   Henry Skinner 09/15/1946 [id number]  Referring provider: Leone Haven, MD [address] [place],  [place] [address] Chief Complaint  Patient presents with  . Benign Prostatic Hypertrophy    HPI: Henry Skinner is a 74 y.o. male seen at the request of Dr. Caryl Bis for evaluation of an abnormal DRE.  -He last saw his PCP on 07/06/2020 and reports worsening LUTS x 5-6 weeks. -He had frequency every 2-3 hours and nocturia x 2-3. -IPSS 6/35 -DRE revealed an enlarged prostate with right side being larger than left.  -PSA was 2.19 as of 07/06/2020.  -UA was negative.  -eliminated caffeine and alcohol 8 days ago and has noted improvement in his symptoms -Denies dysuria, gross hematuria -Denies flank, abdominal or pelvic pain -Prior history stone disease   PMH: Past Medical History:  Diagnosis Date  . Chickenpox   . Kidney stones   . Skin irritation     Surgical History: Past Surgical History:  Procedure Laterality Date  . COLONOSCOPY WITH PROPOFOL N/A 08/30/2019   Procedure: COLONOSCOPY WITH PROPOFOL;  Surgeon: Jonathon Bellows, MD;  Location: [facility] ENDOSCOPY;  Service: Gastroenterology;  Laterality: N/A;  . PAROTIDECTOMY Left 03/04/2016   Procedure: PAROTIDECTOMY;  Surgeon: Beverly Gust, MD;  Location: [facility] ORS;  Service: ENT;  Laterality: Left;  . ROTATOR CUFF REPAIR  May 2016  . SKIN SURGERY     L hand, L arm/shoulder     Home Medications:  Allergies as of 07/12/2020   No Known Allergies     Medication List       Accurate as of July 12, 2020  2:40 PM. If you have any questions, ask your nurse or doctor.        amLODipine 10 MG tablet Commonly known as: NORVASC TAKE 1 TABLET BY MOUTH EVERY DAY   b complex vitamins tablet Take 1 tablet by mouth daily.   Fish Oil 500 MG Caps Take by mouth.   TURMERIC PO Take 1 tablet by mouth at bedtime.   VITAMIN D (CHOLECALCIFEROL) PO Take 2,000 Units by mouth.   vitamin E  1000 UNIT capsule Generic drug: vitamin E Take 1,000 Units by mouth daily.       Allergies: No Known Allergies  Family History: Family History  Problem Relation Age of Onset  . Alcoholism Brother   . Colon cancer Maternal Uncle     Social History:  reports that he has never smoked. He has never used smokeless tobacco. He reports current alcohol use of about 1.0 standard drink of alcohol per week. He reports current drug use. Drug: Marijuana.   Physical Exam: BP (!) 162/82   Pulse 78   Ht 5\' 7"  (1.702 m)   Wt 165 lb (74.8 kg)   BMI 25.84 kg/m   Constitutional:  Alert and oriented, No acute distress. HEENT: [place] AT, moist mucus membranes.  Trachea midline, no masses. Cardiovascular: No clubbing, cyanosis, or edema. Respiratory: Normal respiratory effort, no increased work of breathing. GI: Abdomen is soft, nontender, nondistended, no abdominal masses GU: No CVA tenderness.  No bladder fullness or masses.  Patient with circumcised phallus. Urethral meatus is patent.  No penile discharge. No penile lesions or rashes. Scrotum without lesions, cysts, rashes and/or edema.  Testicles are located scrotally bilaterally. No masses are appreciated in the testicles. Left and right epididymis are normal. Rectal: Patient with  normal sphincter tone. Anus and perineum without scarring or rashes. No rectal masses are appreciated. Prostate is  approximately 40 grams with L >R however the consistency is normal and uniform throughout, no nodules are appreciated. Skin: No rashes, bruises or suspicious lesions. Neurologic: Grossly intact, no focal deficits, moving all 4 extremities. Psychiatric: Normal mood and affect.  Laboratory Data:  Lab Results  Component Value Date   CREATININE 0.95 02/24/2020    Lab Results  Component Value Date   PSA 2.19 07/06/2020   PSA 2.58 03/02/2018    Urinalysis Dipstick/microscopy negative  Pertinent Imaging: Results for orders placed or performed in visit  on 07/12/20  Bladder Scan (Post Void Residual) in office  Result Value Ref Range   Scan Result 0      Assessment & Plan:    1. BPH with urinary frequency Prostate is asymmetric but consistency is normal and uniform. PSA was 2.19 as of 07/06/2020.  Mild LUTS by IPSS PVR is 0 mL.  If symptoms worsen discussed tamsulosin Patient will call back in 1 month for update on current symptoms. Follow-up as needed  [facility] [address], [place], [place] [address] [phone]  I, Selena Batten, am acting as a scribe for Dr. Nicki Reaper C. [place],  I have reviewed the above documentation for accuracy and completeness, and I agree with the above.   Abbie Sons, MD

## 2020-07-12 ENCOUNTER — Ambulatory Visit (INDEPENDENT_AMBULATORY_CARE_PROVIDER_SITE_OTHER): Payer: PPO | Admitting: Urology

## 2020-07-12 ENCOUNTER — Other Ambulatory Visit: Payer: Self-pay

## 2020-07-12 ENCOUNTER — Encounter: Payer: Self-pay | Admitting: Urology

## 2020-07-12 VITALS — BP 162/82 | HR 78 | Ht 67.0 in | Wt 165.0 lb

## 2020-07-12 DIAGNOSIS — R350 Frequency of micturition: Secondary | ICD-10-CM

## 2020-07-12 DIAGNOSIS — N401 Enlarged prostate with lower urinary tract symptoms: Secondary | ICD-10-CM | POA: Diagnosis not present

## 2020-07-12 LAB — BLADDER SCAN AMB NON-IMAGING: Scan Result: 0

## 2020-07-13 LAB — URINALYSIS, COMPLETE
Bilirubin, UA: NEGATIVE
Glucose, UA: NEGATIVE
Ketones, UA: NEGATIVE
Leukocytes,UA: NEGATIVE
Nitrite, UA: NEGATIVE
Protein,UA: NEGATIVE
RBC, UA: NEGATIVE
Specific Gravity, UA: >1.03 — ABNORMAL HIGH (ref 1.005–1.030)
Urobilinogen, Ur: 0.2 mg/dL (ref 0.2–1.0)
pH, UA: 5.5 (ref 5.0–7.5)

## 2020-07-13 LAB — MICROSCOPIC EXAMINATION
Bacteria, UA: NONE SEEN
Epithelial Cells (non renal): NONE SEEN /hpf (ref 0–10)

## 2020-08-29 ENCOUNTER — Ambulatory Visit: Payer: PPO | Admitting: Family Medicine

## 2020-09-11 ENCOUNTER — Other Ambulatory Visit: Payer: Self-pay

## 2020-09-11 ENCOUNTER — Telehealth: Payer: Self-pay

## 2020-09-11 ENCOUNTER — Ambulatory Visit
Admission: EM | Admit: 2020-09-11 | Discharge: 2020-09-11 | Disposition: A | Discharge status: Home / Self Care | Payer: PPO | Attending: Family Medicine | Admitting: Family Medicine

## 2020-09-11 DIAGNOSIS — R21 Rash and other nonspecific skin eruption: Secondary | ICD-10-CM | POA: Diagnosis not present

## 2020-09-11 DIAGNOSIS — R2243 Localized swelling, mass and lump, lower limb, bilateral: Secondary | ICD-10-CM | POA: Diagnosis not present

## 2020-09-11 DIAGNOSIS — L249 Irritant contact dermatitis, unspecified cause: Secondary | ICD-10-CM

## 2020-09-11 HISTORY — DX: Essential (primary) hypertension: I10

## 2020-09-11 MED ORDER — TRIAMCINOLONE ACETONIDE 0.1 % EX CREA: 1.0000 "application " | TOPICAL_CREAM | Freq: Two times a day (BID) | CUTANEOUS | 0 refills | Status: DC

## 2020-09-11 NOTE — ED Triage Notes (Signed)
Pt reports BLE swelling x 4 days from feet to knees.  Also has BLE rash x 1-2 days.  Denies CP, SOB.

## 2020-09-11 NOTE — Telephone Encounter (Signed)
Patient was seen by UC today.

## 2020-09-11 NOTE — Telephone Encounter (Signed)
Patient came into clinic today with Swelling to both legs with a rash below the knees on shin and calves. Patients stated the swelling started last Thursday after working outside moving wood in a wheel barrel. He stated the rash developed over the weekend and the swelling in ankles started yesterday. Patient thought it could've been an insect bite and figured it would go away on its own. Both legs are pitting, red, swollen/tight, and warm to the touch. He does not have any other sx. No blurry vision,arm px chest px, SOB, fever, Chills, nausea, vomiting, heart palpitations, and headaches. Patient has not taken any medication for sx. Due to no availability I instructed patient to go to an Urgent care to be evaluated due to his sx.  Wt:169.4 lbs BP: 158/67 BPM:70 Temp: 98.2 Spo2: 97%

## 2020-09-11 NOTE — Discharge Instructions (Signed)
May use steroid cream to rash on legs twice a day  Labs are pending. We will be in contact with you with any abnormal results.  Follow up with this office or with primary care if symptoms are persisting.  Follow up in the ER for high fever, trouble swallowing, trouble breathing, other concerning symptoms.

## 2020-09-11 NOTE — Telephone Encounter (Signed)
Noted. Agree with need for evaluation. Please follow-up with the patient to see if he was evaluated. Thanks.

## 2020-09-11 NOTE — ED Provider Notes (Addendum)
[place]   [id number] 09/11/20 Arrival Time: 4098  CC: RASH  SUBJECTIVE:  Henry Skinner is a 74 y.o. male who presents with a skin complaint that began about 4 days ago. Reports BLE swelling, also has red itchy rash to bilateral calves. Reports that he uses skin so soft daily before going outside. Reports that he has had this rash before, but not the swelling. States that rash usually resolves on its own. Denies cardiac history. Denies medications change or starting a new medication recently. There are no aggravating or alleviating factors. Denies fever, chills, nausea, vomiting, discharge, oral lesions, SOB, chest pain, abdominal pain, changes in bowel or bladder function.    ROS: As per HPI.  All other pertinent ROS negative.     Past Medical History:  Diagnosis Date  . Chickenpox   . Hypertension   . Kidney stones   . Skin irritation    Past Surgical History:  Procedure Laterality Date  . COLONOSCOPY WITH PROPOFOL N/A 08/30/2019   Procedure: COLONOSCOPY WITH PROPOFOL;  Surgeon: Jonathon Bellows, MD;  Location: [facility] ENDOSCOPY;  Service: Gastroenterology;  Laterality: N/A;  . PAROTIDECTOMY Left 03/04/2016   Procedure: PAROTIDECTOMY;  Surgeon: Beverly Gust, MD;  Location: [facility] ORS;  Service: ENT;  Laterality: Left;  . ROTATOR CUFF REPAIR  May 2016  . SKIN SURGERY     L hand, L arm/shoulder    No Known Allergies No current facility-administered medications on file prior to encounter.   Current Outpatient Medications on File Prior to Encounter  Medication Sig Dispense Refill  . amLODipine (NORVASC) 10 MG tablet TAKE 1 TABLET BY MOUTH EVERY DAY 90 tablet 1  . b complex vitamins tablet Take 1 tablet by mouth daily.    . Omega-3 Fatty Acids (FISH OIL) 500 MG CAPS Take by mouth.    . TURMERIC PO Take 1 tablet by mouth at bedtime.    Marland Kitchen VITAMIN D, CHOLECALCIFEROL, PO Take 2,000 Units by mouth.    . vitamin E (VITAMIN E) 1000 UNIT capsule Take 1,000 Units by mouth daily.      Social History   Socioeconomic History  . Marital status: Single    Spouse name: Not on file  . Number of children: Not on file  . Years of education: Not on file  . Highest education level: Not on file  Occupational History  . Not on file  Tobacco Use  . Smoking status: Never Smoker  . Smokeless tobacco: Never Used  Vaping Use  . Vaping Use: Never used  Substance and Sexual Activity  . Alcohol use: Yes    Alcohol/week: 1.0 standard drink    Types: 1 Standard drinks or equivalent per week    Comment: 1 beer daily  . Drug use: Yes    Types: Marijuana    Comment: marijuana, rarely. Last use 11/2018  . Sexual activity: Yes  Other Topics Concern  . Not on file  Social History Narrative  . Not on file   Social Determinants of Health   Financial Resource Strain:   . Difficulty of Paying Living Expenses: Not on file  Food Insecurity:   . Worried About Charity fundraiser in the Last Year: Not on file  . Ran Out of Food in the Last Year: Not on file  Transportation Needs:   . Lack of Transportation (Medical): Not on file  . Lack of Transportation (Non-Medical): Not on file  Physical Activity:   . Days of Exercise per Week: Not on  file  . Minutes of Exercise per Session: Not on file  Stress:   . Feeling of Stress : Not on file  Social Connections:   . Frequency of Communication with Friends and Family: Not on file  . Frequency of Social Gatherings with Friends and Family: Not on file  . Attends Religious Services: Not on file  . Active Member of Clubs or Organizations: Not on file  . Attends Archivist Meetings: Not on file  . Marital Status: Not on file  Intimate Partner Violence:   . Fear of Current or Ex-Partner: Not on file  . Emotionally Abused: Not on file  . Physically Abused: Not on file  . Sexually Abused: Not on file   Family History  Problem Relation Age of Onset  . Alcoholism Brother   . Colon cancer Maternal Uncle     OBJECTIVE: Vitals:    09/11/20 1028  BP: (!) 157/89  Pulse: 67  Resp: 16  Temp: 98.2 F (36.8 C)  TempSrc: Oral  SpO2: 98%    General appearance: alert; no distress Head: NCAT Lungs: clear to auscultation bilaterally Heart: regular rate and rhythm.  Radial pulse 2+ bilaterally Extremities: BLE edema, nonpitting Skin: warm and dry; erythematous maculopapular rash to both calves, nontender Psychological: alert and cooperative; normal mood and affect  ASSESSMENT & PLAN:  1. Irritant contact dermatitis, unspecified trigger   2. Localized swelling of both lower legs   3. Rash and nonspecific skin eruption     Meds ordered this encounter  Medications  . triamcinolone cream (KENALOG) 0.1 %    Sig: Apply 1 application topically 2 (two) times daily.    Dispense:  30 g    Refill:  0    Order Specific Question:   Supervising Provider    Answer:   MARCELLIUS, MONTAGNA [[id number]]    Prescribed triamcinolone CBC, CMP pending Checking kidney function and electrolytes for cause of swelling Will follow up with abnormal results Push fluids Take as prescribed and to completion Avoid hot showers/ baths Moisturize skin daily  Follow up with PCP if symptoms persists Return or go to the ER if you have any new or worsening symptoms such as fever, chills, nausea, vomiting, redness, swelling, discharge, if symptoms do not improve with medications  Reviewed expectations re: course of current medical issues. Questions answered. Outlined signs and symptoms indicating need for more acute intervention. Patient verbalized understanding. After Visit Summary given.   Faustino Congress, NP 09/11/20 1100    Faustino Congress, NP 09/11/20 1101

## 2020-09-12 LAB — COMPREHENSIVE METABOLIC PANEL
ALT: 31 IU/L (ref 0–44)
AST: 26 IU/L (ref 0–40)
Albumin/Globulin Ratio: 1.7 (ref 1.2–2.2)
Albumin: 4.7 g/dL (ref 3.7–4.7)
Alkaline Phosphatase: 86 IU/L (ref 44–121)
BUN/Creatinine Ratio: 18 (ref 10–24)
BUN: 14 mg/dL (ref 8–27)
Bilirubin Total: 0.7 mg/dL (ref 0.0–1.2)
CO2: 25 mmol/L (ref 20–29)
Calcium: 9.4 mg/dL (ref 8.6–10.2)
Chloride: 104 mmol/L (ref 96–106)
Creatinine, Ser: 0.77 mg/dL (ref 0.76–1.27)
GFR calc Af Amer: 105 mL/min/{1.73_m2} (ref >59)
GFR calc non Af Amer: 91 mL/min/{1.73_m2} (ref >59)
Globulin, Total: 2.8 g/dL (ref 1.5–4.5)
Glucose: 101 mg/dL — ABNORMAL HIGH (ref 65–99)
Potassium: 4.1 mmol/L (ref 3.5–5.2)
Sodium: 141 mmol/L (ref 134–144)
Total Protein: 7.5 g/dL (ref 6.0–8.5)

## 2020-09-12 LAB — CBC
Hematocrit: 45.4 % (ref 37.5–51.0)
Hemoglobin: 15.2 g/dL (ref 13.0–17.7)
MCH: 30 pg (ref 26.6–33.0)
MCHC: 33.5 g/dL (ref 31.5–35.7)
MCV: 90 fL (ref 79–97)
Platelets: 235 10*3/uL (ref 150–450)
RBC: 5.06 x10E6/uL (ref 4.14–5.80)
RDW: 12.3 % (ref 11.6–15.4)
WBC: 5.2 10*3/uL (ref 3.4–10.8)

## 2020-09-20 ENCOUNTER — Other Ambulatory Visit: Payer: Self-pay | Admitting: Family Medicine

## 2020-09-20 DIAGNOSIS — I10 Essential (primary) hypertension: Secondary | ICD-10-CM

## 2020-10-08 ENCOUNTER — Other Ambulatory Visit: Payer: Self-pay

## 2020-10-08 ENCOUNTER — Encounter: Payer: Self-pay | Admitting: Family Medicine

## 2020-10-08 ENCOUNTER — Ambulatory Visit (INDEPENDENT_AMBULATORY_CARE_PROVIDER_SITE_OTHER): Payer: PPO | Admitting: Family Medicine

## 2020-10-08 VITALS — BP 140/78 | HR 80 | Temp 98.6°F | Ht 67.0 in | Wt 171.4 lb

## 2020-10-08 DIAGNOSIS — M7989 Other specified soft tissue disorders: Secondary | ICD-10-CM | POA: Insufficient documentation

## 2020-10-08 DIAGNOSIS — N401 Enlarged prostate with lower urinary tract symptoms: Secondary | ICD-10-CM

## 2020-10-08 DIAGNOSIS — R350 Frequency of micturition: Secondary | ICD-10-CM

## 2020-10-08 DIAGNOSIS — I10 Essential (primary) hypertension: Secondary | ICD-10-CM

## 2020-10-08 MED ORDER — LOSARTAN POTASSIUM 50 MG PO TABS: 50.0000 mg | ORAL_TABLET | Freq: Every day | ORAL | 3 refills | Status: DC

## 2020-10-08 NOTE — Patient Instructions (Signed)
Nice to see you. We will change you to losartan for your BP. Please stop the amlodipine.  We will check a TSH today and contact you with the results.

## 2020-10-09 LAB — CBC WITH DIFFERENTIAL/PLATELET
Basophils Absolute: 0.1 10*3/uL (ref 0.0–0.1)
Basophils Relative: 1.2 % (ref 0.0–3.0)
Eosinophils Absolute: 0.2 10*3/uL (ref 0.0–0.7)
Eosinophils Relative: 3.2 % (ref 0.0–5.0)
HCT: 43.3 % (ref 39.0–52.0)
Hemoglobin: 14.8 g/dL (ref 13.0–17.0)
Lymphocytes Relative: 28.7 % (ref 12.0–46.0)
Lymphs Abs: 1.4 10*3/uL (ref 0.7–4.0)
MCHC: 34.1 g/dL (ref 30.0–36.0)
MCV: 89.4 fl (ref 78.0–100.0)
Monocytes Absolute: 0.5 10*3/uL (ref 0.1–1.0)
Monocytes Relative: 10.3 % (ref 3.0–12.0)
Neutro Abs: 2.8 10*3/uL (ref 1.4–7.7)
Neutrophils Relative %: 56.6 % (ref 43.0–77.0)
Platelets: 245 10*3/uL (ref 150.0–400.0)
RBC: 4.85 Mil/uL (ref 4.22–5.81)
RDW: 13.3 % (ref 11.5–15.5)
WBC: 5 10*3/uL (ref 4.0–10.5)

## 2020-10-09 LAB — TSH: TSH: 1.04 u[IU]/mL (ref 0.35–4.50)

## 2020-10-09 NOTE — Assessment & Plan Note (Signed)
We are transitioning him to losartan 50 mg once daily.  He will return in 7 to 10 days for labs.  He will discontinue amlodipine.

## 2020-10-09 NOTE — Assessment & Plan Note (Signed)
Symptoms have improved.  He will monitor for recurrence.

## 2020-10-09 NOTE — Progress Notes (Signed)
Tommi Rumps, MD Phone: [phone]  Henry Skinner is a 74 y.o. male who presents today for follow-up.  BPH: Patient notes no urinary issues.  He is no longer straining.  He does empty his bladder.  He has good flow.  He did see urology who recommended treatment if his symptoms worsened.  Leg swelling: This is bilateral.  This has been going on since the middle of September.  He got in some weeds in the yard and feels as though that irritated his skin.  He also has had bedbugs at home and they were biting the bottom of his legs.  He has not gotten rid of the mattress though he has been sleeping in his car.  He did scratch the lateral aspect of his left leg with a nail.  Last tetanus vaccine was in 2017.  He notes redness in his dorsal feet and in his lower legs only when he wear shoes and socks.  He is on amlodipine.  He was seen at urgent care in late October for this and diagnosed with contact dermatitis.  He has been using some triamcinolone.  No fevers.  Social History   Tobacco Use  Smoking Status Never Smoker  Smokeless Tobacco Never Used     ROS see history of present illness  Objective  Physical Exam Vitals:   10/08/20 1535  BP: 140/78  Pulse: 80  Temp: 98.6 F (37 C)  SpO2: 98%    BP Readings from Last 3 Encounters:  10/08/20 140/78  09/11/20 (!) 157/89  07/12/20 (!) 162/82   Wt Readings from Last 3 Encounters:  10/08/20 171 lb 6.4 oz (77.7 kg)  07/12/20 165 lb (74.8 kg)  07/06/20 169 lb 12.8 oz (77 kg)    Physical Exam Constitutional:      General: He is not in acute distress.    Appearance: He is not diaphoretic.  Cardiovascular:     Rate and Rhythm: Normal rate and regular rhythm.     Heart sounds: Normal heart sounds.  Pulmonary:     Effort: Pulmonary effort is normal.     Breath sounds: Normal breath sounds.  Musculoskeletal:     Comments: Bilateral lower extremity edema to the top of his shins, there is some erythema in the dorsum of his feet  and in his lower legs bilaterally, there is no warmth to the erythema, no tenderness, excoriated lesions noted  Skin:    General: Skin is warm and dry.  Neurological:     Mental Status: He is alert.      Assessment/Plan: Please see individual problem list.  Problem List Items Addressed This Visit    BPH (benign prostatic hyperplasia)    Symptoms have improved.  He will monitor for recurrence.      Essential hypertension - Primary    We are transitioning him to losartan 50 mg once daily.  He will return in 7 to 10 days for labs.  He will discontinue amlodipine.      Relevant Medications   losartan (COZAAR) 50 MG tablet   Other Relevant Orders   Basic Metabolic Panel (BMET)   Leg swelling    Possibly related to venous stasis dermatitis versus contact dermatitis versus related to his amlodipine.  We will discontinue his amlodipine.  He will see if this improves in the next several days.  We will check lab work as outlined below to evaluate for other causes.      Relevant Orders   TSH (Completed)  CBC w/Diff (Completed)      This visit occurred during the SARS-CoV-2 public health emergency.  Safety protocols were in place, including screening questions prior to the visit, additional usage of staff PPE, and extensive cleaning of exam room while observing appropriate contact time as indicated for disinfecting solutions.    Tommi Rumps, MD [place]

## 2020-10-09 NOTE — Assessment & Plan Note (Signed)
Possibly related to venous stasis dermatitis versus contact dermatitis versus related to his amlodipine.  We will discontinue his amlodipine.  He will see if this improves in the next several days.  We will check lab work as outlined below to evaluate for other causes.

## 2020-10-15 ENCOUNTER — Other Ambulatory Visit: Payer: Self-pay

## 2020-10-15 ENCOUNTER — Other Ambulatory Visit (INDEPENDENT_AMBULATORY_CARE_PROVIDER_SITE_OTHER): Payer: PPO

## 2020-10-15 DIAGNOSIS — I10 Essential (primary) hypertension: Secondary | ICD-10-CM | POA: Diagnosis not present

## 2020-10-15 LAB — BASIC METABOLIC PANEL
BUN: 19 mg/dL (ref 6–23)
CO2: 33 mEq/L — ABNORMAL HIGH (ref 19–32)
Calcium: 9.5 mg/dL (ref 8.4–10.5)
Chloride: 101 mEq/L (ref 96–112)
Creatinine, Ser: 0.85 mg/dL (ref 0.40–1.50)
GFR: 86.83 mL/min (ref >60.00)
Glucose, Bld: 65 mg/dL — ABNORMAL LOW (ref 70–99)
Potassium: 4 mEq/L (ref 3.5–5.1)
Sodium: 141 mEq/L (ref 135–145)

## 2020-10-31 ENCOUNTER — Other Ambulatory Visit: Payer: Self-pay

## 2020-10-31 ENCOUNTER — Ambulatory Visit (INDEPENDENT_AMBULATORY_CARE_PROVIDER_SITE_OTHER): Payer: PPO

## 2020-10-31 VITALS — BP 136/66 | HR 69

## 2020-10-31 DIAGNOSIS — I10 Essential (primary) hypertension: Secondary | ICD-10-CM

## 2020-10-31 NOTE — Progress Notes (Signed)
Patient is here for a BP check due to bp being high at last visit, as per patient.  Currently patients BP is 136/66 and BPM is 69.  Patient has no complaints of headaches, blurry vision, chest pain, arm pain, light headedness, dizziness, and nor jaw pain. Please see previous note for order.

## 2020-11-13 ENCOUNTER — Ambulatory Visit: Payer: PPO | Admitting: Family Medicine

## 2020-11-13 ENCOUNTER — Other Ambulatory Visit: Payer: Self-pay

## 2020-11-15 ENCOUNTER — Encounter: Payer: Self-pay | Admitting: Family Medicine

## 2020-11-15 ENCOUNTER — Other Ambulatory Visit: Payer: Self-pay

## 2020-11-15 ENCOUNTER — Ambulatory Visit (INDEPENDENT_AMBULATORY_CARE_PROVIDER_SITE_OTHER): Payer: PPO | Admitting: Family Medicine

## 2020-11-15 DIAGNOSIS — M7989 Other specified soft tissue disorders: Secondary | ICD-10-CM | POA: Diagnosis not present

## 2020-11-15 DIAGNOSIS — I10 Essential (primary) hypertension: Secondary | ICD-10-CM | POA: Diagnosis not present

## 2020-11-15 NOTE — Assessment & Plan Note (Addendum)
Very close to goal on recheck.  Discussed that I would like to see it less than 130/80.  He does not want to increase his medication thus we will continue with losartan 50 mg once daily.  Follow-up in 6 weeks.

## 2020-11-15 NOTE — Progress Notes (Signed)
  Marikay Alar, MD Phone: [phone]  Henry Skinner is a 74 y.o. male who presents today for f/u.  HYPERTENSION  Disease Monitoring  Home BP Monitoring not checking Chest pain- no    Dyspnea- no Medications  Compliance-  Taking losartan 50 mg daily.     Edema-this has improved with stopping the amlodipine though he does still have some mild swelling that is present most of the time.  It does come and go to a certain degree.  He does get occasional itching and rash in the areas of his lower legs that swell.  He also feels like the losartan dried up the skin on his feet.  He does note the rash has improved.    Social History   Tobacco Use  Smoking Status Never Smoker  Smokeless Tobacco Never Used     ROS see history of present illness  Objective  Physical Exam Vitals:   11/15/20 1322 11/15/20 1334  BP: 134/80 130/70  Pulse:    Temp:    SpO2:      BP Readings from Last 3 Encounters:  11/15/20 130/70  10/31/20 136/66  10/08/20 140/78   Wt Readings from Last 3 Encounters:  11/15/20 176 lb 3.2 oz (79.9 kg)  10/08/20 171 lb 6.4 oz (77.7 kg)  07/12/20 165 lb (74.8 kg)    Physical Exam Constitutional:      General: He is not in acute distress.    Appearance: He is not diaphoretic.  Cardiovascular:     Rate and Rhythm: Normal rate and regular rhythm.     Heart sounds: Normal heart sounds.  Pulmonary:     Effort: Pulmonary effort is normal.     Breath sounds: Normal breath sounds.  Musculoskeletal:        General: No edema.     Comments: 1+ pitting edema bilateral lower extremities  Skin:    General: Skin is warm and dry.  Neurological:     Mental Status: He is alert.      Assessment/Plan: Please see individual problem list.  Problem List Items Addressed This Visit    Essential hypertension    Very close to goal on recheck.  Discussed that I would like to see it less than 130/80.  He does not want to increase his medication thus we will continue with  losartan 50 mg once daily.  Follow-up in 6 weeks.      Leg swelling    Improved to a certain degree though does still have some issues with this.  I suspect the amlodipine was contributing to a certain degree though I also suspect he has venous insufficiency.  The patient would like to try compression stockings and thus we will get those sent in for him.         This visit occurred during the SARS-CoV-2 public health emergency.  Safety protocols were in place, including screening questions prior to the visit, additional usage of staff PPE, and extensive cleaning of exam room while observing appropriate contact time as indicated for disinfecting solutions.    Marikay Alar, MD [facility]

## 2020-11-15 NOTE — Assessment & Plan Note (Addendum)
Improved to a certain degree though does still have some issues with this.  I suspect the amlodipine was contributing to a certain degree though I also suspect he has venous insufficiency.  The patient would like to try compression stockings and thus we will get those sent in for him.

## 2020-11-15 NOTE — Patient Instructions (Signed)
Nice to see you. We will contact you when we know where you need to go to get your compression stockings. Please continue your losartan 50 mg once daily.

## 2020-11-20 MED ORDER — MEDICAL COMPRESSION THIGH HIGH MISC: 3 refills | Status: AC

## 2020-11-20 NOTE — Addendum Note (Signed)
Addended by: Dennie Bible on: 11/20/2020 02:42 PM   Modules accepted: Orders

## 2020-11-20 NOTE — Progress Notes (Signed)
I have placed the order with pharmacy name is your sign tray.

## 2020-11-27 DIAGNOSIS — L821 Other seborrheic keratosis: Secondary | ICD-10-CM | POA: Diagnosis not present

## 2020-11-27 DIAGNOSIS — I872 Venous insufficiency (chronic) (peripheral): Secondary | ICD-10-CM | POA: Diagnosis not present

## 2020-11-27 DIAGNOSIS — D2261 Melanocytic nevi of right upper limb, including shoulder: Secondary | ICD-10-CM | POA: Diagnosis not present

## 2020-11-27 DIAGNOSIS — R600 Localized edema: Secondary | ICD-10-CM | POA: Diagnosis not present

## 2020-11-27 DIAGNOSIS — L570 Actinic keratosis: Secondary | ICD-10-CM | POA: Diagnosis not present

## 2020-11-27 DIAGNOSIS — D2272 Melanocytic nevi of left lower limb, including hip: Secondary | ICD-10-CM | POA: Diagnosis not present

## 2020-11-27 DIAGNOSIS — Z85828 Personal history of other malignant neoplasm of skin: Secondary | ICD-10-CM | POA: Diagnosis not present

## 2020-11-27 DIAGNOSIS — Z08 Encounter for follow-up examination after completed treatment for malignant neoplasm: Secondary | ICD-10-CM | POA: Diagnosis not present

## 2020-12-28 ENCOUNTER — Ambulatory Visit (INDEPENDENT_AMBULATORY_CARE_PROVIDER_SITE_OTHER): Payer: PPO | Admitting: Family Medicine

## 2020-12-28 ENCOUNTER — Encounter: Payer: Self-pay | Admitting: Family Medicine

## 2020-12-28 ENCOUNTER — Other Ambulatory Visit: Payer: Self-pay

## 2020-12-28 DIAGNOSIS — R7303 Prediabetes: Secondary | ICD-10-CM

## 2020-12-28 DIAGNOSIS — I10 Essential (primary) hypertension: Secondary | ICD-10-CM | POA: Diagnosis not present

## 2020-12-28 LAB — POCT GLYCOSYLATED HEMOGLOBIN (HGB A1C): Hemoglobin A1C: 5.6 % (ref 4.0–5.6)

## 2020-12-28 NOTE — Patient Instructions (Signed)
Nice to see you. Please stop your herbal supplements. Please continue your losartan.

## 2020-12-28 NOTE — Assessment & Plan Note (Signed)
Adequately controlled.  He will continue losartan 50 mg once daily.  I advised against using herbal supplements.  Discussed that they are not regulated and not well studied.  Discussed that we also do not know exactly how they interact with medications.

## 2020-12-28 NOTE — Assessment & Plan Note (Signed)
Check A1c.  Continue diet and exercise. 

## 2020-12-28 NOTE — Progress Notes (Signed)
Henry Rumps, MD Phone: [phone]  Alvar Malinoski is a 75 y.o. male who presents today for f/u.  HYPERTENSION  Disease Monitoring  Home BP Monitoring not checking Chest pain- no    Dyspnea- no Medications  Compliance-  Taking losartan 50 mg daily.  Edema- no, resolved Reports he started a herbal supplement to help with his leg swelling.  Prediabetes: No polyuria or polydipsia.  He is eating a lot of salads with all of oil and lots of vegetables, lettuce, trail mix, and raisins on it.  He is walking for exercise.    Social History   Tobacco Use  Smoking Status Never Smoker  Smokeless Tobacco Never Used    Current Outpatient Medications on File Prior to Visit  Medication Sig Dispense Refill  . b complex vitamins tablet Take 1 tablet by mouth daily.    Water engineer Bandages & Supports (MEDICAL COMPRESSION THIGH HIGH) MISC Apply in the morning and remove at bedtime. 2 each 3  . losartan (COZAAR) 50 MG tablet Take 1 tablet (50 mg total) by mouth daily. 90 tablet 3  . Omega-3 Fatty Acids (FISH OIL) 500 MG CAPS Take by mouth.    Marland Kitchen Specialty Vitamins Products (PROSTATE PO) Take by mouth. Force Factor Brand    . TURMERIC PO Take 1 tablet by mouth at bedtime.    Marland Kitchen VITAMIN D, CHOLECALCIFEROL, PO Take 2,000 Units by mouth.    . vitamin E 1000 UNIT capsule Take 1,000 Units by mouth daily.     No current facility-administered medications on file prior to visit.     ROS see history of present illness  Objective  Physical Exam Vitals:   12/28/20 1453  BP: 120/70  Pulse: 77  Temp: 98.5 F (36.9 C)  SpO2: 97%    BP Readings from Last 3 Encounters:  12/28/20 120/70  11/15/20 130/70  10/31/20 136/66   Wt Readings from Last 3 Encounters:  12/28/20 173 lb 9.6 oz (78.7 kg)  11/15/20 176 lb 3.2 oz (79.9 kg)  10/08/20 171 lb 6.4 oz (77.7 kg)    Physical Exam Constitutional:      General: He is not in acute distress.    Appearance: He is not diaphoretic.  Cardiovascular:      Rate and Rhythm: Normal rate and regular rhythm.     Heart sounds: Normal heart sounds.  Pulmonary:     Effort: Pulmonary effort is normal.     Breath sounds: Normal breath sounds.  Musculoskeletal:        General: No edema.     Right lower leg: No edema.     Left lower leg: No edema.  Skin:    General: Skin is warm and dry.  Neurological:     Mental Status: He is alert.      Assessment/Plan: Please see individual problem list.  Problem List Items Addressed This Visit    Essential hypertension    Adequately controlled.  He will continue losartan 50 mg once daily.  I advised against using herbal supplements.  Discussed that they are not regulated and not well studied.  Discussed that we also do not know exactly how they interact with medications.      Prediabetes    Check A1c.  Continue diet and exercise.      Relevant Orders   POCT HgB A1C (Completed)       This visit occurred during the SARS-CoV-2 public health emergency.  Safety protocols were in place, including screening questions prior to  the visit, additional usage of staff PPE, and extensive cleaning of exam room while observing appropriate contact time as indicated for disinfecting solutions.    Henry Rumps, MD [place]

## 2021-01-07 ENCOUNTER — Telehealth: Payer: Self-pay | Admitting: Family Medicine

## 2021-01-07 NOTE — Telephone Encounter (Signed)
Patient called in about his   losartan (COZAAR) 50 MG tablet stated that pharmacy is still calling him about the old medication

## 2021-01-07 NOTE — Telephone Encounter (Signed)
I called and spoke to the pharmacist and they deactivated the amlodipine Rx per request.  Okechukwu Regnier,cma

## 2021-01-08 NOTE — Progress Notes (Signed)
I called the patient's pharmacy and informed them that the patient is no longer on amlodipine and they have discontinued it.  Nina,cma

## 2021-03-12 ENCOUNTER — Ambulatory Visit (INDEPENDENT_AMBULATORY_CARE_PROVIDER_SITE_OTHER): Payer: PPO | Admitting: Adult Health

## 2021-03-12 ENCOUNTER — Encounter: Payer: Self-pay | Admitting: Adult Health

## 2021-03-12 ENCOUNTER — Other Ambulatory Visit: Payer: Self-pay

## 2021-03-12 VITALS — BP 140/80 | HR 79 | Temp 96.0°F | Ht 67.01 in | Wt 172.0 lb

## 2021-03-12 DIAGNOSIS — Z23 Encounter for immunization: Secondary | ICD-10-CM | POA: Diagnosis not present

## 2021-03-12 DIAGNOSIS — T148XXA Other injury of unspecified body region, initial encounter: Secondary | ICD-10-CM | POA: Insufficient documentation

## 2021-03-12 DIAGNOSIS — T1490XA Injury, unspecified, initial encounter: Secondary | ICD-10-CM | POA: Diagnosis not present

## 2021-03-12 MED ORDER — CEPHALEXIN 500 MG PO CAPS: 500.0000 mg | ORAL_CAPSULE | Freq: Three times a day (TID) | ORAL | 0 refills | Status: DC

## 2021-03-12 NOTE — Patient Instructions (Signed)
Wound Care, Adult Taking care of your wound properly can help to prevent pain, infection, and scarring. It can also help your wound heal more quickly. Follow instructions from your health care provider about how to care for your wound. Supplies needed:  Soap and water.  Wound cleanser.  Gauze.  If needed, a clean bandage (dressing) or other type of wound dressing material to cover or place in the wound. Follow your health care provider's instructions about what dressing supplies to use.  Cream or ointment to apply to the wound, if told by your health care provider. How to care for your wound Cleaning the wound Ask your health care provider how to clean the wound. This may include:  Using mild soap and water or a wound cleanser.  Using a clean gauze to pat the wound dry after cleaning it. Do not rub or scrub the wound. Dressing care  Wash your hands with soap and water for at least 20 seconds before and after you change the dressing. If soap and water are not available, use hand sanitizer.  Change your dressing as told by your health care provider. This may include: ? Cleaning or rinsing out (irrigating) the wound. ? Placing a dressing over the wound or in the wound (packing). ? Covering the wound with an outer dressing.  Leave any stitches (sutures), skin glue, or adhesive strips in place. These skin closures may need to stay in place for 2 weeks or longer. If adhesive strip edges start to loosen and curl up, you may trim the loose edges. Do not remove adhesive strips completely unless your health care provider tells you to do that.  Ask your health care provider when you can leave the wound uncovered. Checking for infection Check your wound area every day for signs of infection. Check for:  More redness, swelling, or pain.  Fluid or blood.  Warmth.  Pus or a bad smell.   Follow these instructions at home Medicines  If you were prescribed an antibiotic medicine, cream, or  ointment, take or apply it as told by your health care provider. Do not stop using the antibiotic even if your condition improves.  If you were prescribed pain medicine, take it 30 minutes before you do any wound care or as told by your health care provider.  Take over-the-counter and prescription medicines only as told by your health care provider. Eating and drinking  Eat a diet that includes protein, vitamin A, vitamin C, and other nutrient-rich foods to help the wound heal. ? Foods rich in protein include meat, fish, eggs, dairy, beans, and nuts. ? Foods rich in vitamin A include carrots and dark green, leafy vegetables. ? Foods rich in vitamin C include citrus fruits, tomatoes, broccoli, and peppers.  Drink enough fluid to keep your urine pale yellow. General instructions  Do not take baths, swim, use a hot tub, or do anything that would put the wound underwater until your health care provider approves. Ask your health care provider if you may take showers. You may only be allowed to take sponge baths.  Do not scratch or pick at the wound. Keep it covered as told by your health care provider.  Return to your normal activities as told by your health care provider. Ask your health care provider what activities are safe for you.  Protect your wound from the sun when you are outside for the first 6 months, or for as long as told by your health care provider. Cover   up the scar area or apply sunscreen that has an SPF of at least 30.  Do not use any products that contain nicotine or tobacco, such as cigarettes, e-cigarettes, and chewing tobacco. These may delay wound healing. If you need help quitting, ask your health care provider.  Keep all follow-up visits as told by your health care provider. This is important. Contact a health care provider if:  You received a tetanus shot and you have swelling, severe pain, redness, or bleeding at the injection site.  Your pain is not controlled  with medicine.  You have any of these signs of infection: ? More redness, swelling, or pain around the wound. ? Fluid or blood coming from the wound. ? Warmth coming from the wound. ? Pus or a bad smell coming from the wound. ? A fever or chills.  You are nauseous or you vomit.  You are dizzy. Get help right away if:  You have a red streak of skin near the area around your wound.  Your wound has been closed with staples, sutures, skin glue, or adhesive strips and it begins to open up and separate.  Your wound is bleeding, and the bleeding does not stop with gentle pressure.  You have a rash.  You faint.  You have trouble breathing. These symptoms may represent a serious problem that is an emergency. Do not wait to see if the symptoms will go away. Get medical help right away. Call your local emergency services (911 in the [place] Do not drive yourself to the hospital. Summary  Always wash your hands with soap and water for at least 20 seconds before and after changing your dressing.  Change your dressing as told by your health care provider.  To help with healing, eat foods that are rich in protein, vitamin A, vitamin C, and other nutrients.  Check your wound every day for signs of infection. Contact your health care provider if you suspect that your wound is infected. This information is not intended to replace advice given to you by your health care provider. Make sure you discuss any questions you have with your health care provider. Document Revised: 08/19/2019 Document Reviewed: 08/19/2019 Elsevier Patient Education  2021 [employer]. Diphtheria Toxoid; Tetanus Toxoid Adsorbed, DT, Td What is this medicine? DIPHTHERIA AND TETANUS TOXOIDS ADSORBED (dif THEER ee uh and TET n [place] TOK soids ad SAWRB) is a vaccine. It is used to prevent infections of diphtheria and tetanus (lockjaw). This medicine may be used for other purposes; ask your health care provider or pharmacist if  you have questions. COMMON BRAND NAME(S): DECAVAC, TDVAX, TENIVAC What should I tell my health care provider before I take this medicine? They need to know if you have any of these conditions:  bleeding disorder  immune system problems  infection with fever  low levels of platelets in the blood  an unusual or allergic reaction to diphtheria or tetanus toxoid, latex, thimerosal, other medicines, foods, dyes, or preservatives  pregnant or trying to get pregnant  breast-feeding How should I use this medicine? This vaccine is for injection into a muscle. It is given by a health care professional. A copy of Vaccine Information Statements will be given before each vaccination. Read this sheet carefully each time. The sheet may change frequently. Talk to your pediatrician regarding the use of this medicine in children. While this drug may be prescribed for selected conditions, precautions do apply. Overdosage: If you think you have taken too much of  this medicine contact a poison control center or emergency room at once. NOTE: This medicine is only for you. Do not share this medicine with others. What if I miss a dose? Keep appointments for follow-up (booster) doses as directed. It is important not to miss your dose. Call your doctor or health care professional if you are unable to keep an appointment. What may interact with this medicine?  adalimumab  anakinra  infliximab  live vaccines  medicines that suppress your immune system  medicines to treat cancer  medicines that treat or prevent blood clots like daily aspirin, enoxaparin, heparin, ticlopidine, warfarin  radiopharmaceuticals like iodine I-125 or I-131 This list may not describe all possible interactions. Give your health care provider a list of all the medicines, herbs, non-prescription drugs, or dietary supplements you use. Also tell them if you smoke, drink alcohol, or use illegal drugs. Some items may interact with  your medicine. What should I watch for while using this medicine? Contact your doctor or health care professional and seek emergency medical care if any serious side effects occur. This vaccine, like all vaccines, may not fully protect everyone. What side effects may I notice from receiving this medicine? Side effects that you should report to your doctor or health care professional as soon as possible:  allergic reactions like skin rash, itching or hives, swelling of the face, lips, or tongue  arthritis pain  breathing problems  changes in hearing  extreme changes in behavior  fast, irregular heartbeat  fever over 100 degrees F  pain, tingling, numbness in the hands or feet  seizures  unusually weak or tired Side effects that usually do not require medical attention (report to your doctor or health care professional if they continue or are bothersome):  aches or pains  bruising, pain, swelling at site where injected  headache  loss of appetite  low-grade fever of 100 degrees F or less  nausea, vomiting  sleepy  swollen glands This list may not describe all possible side effects. Call your doctor for medical advice about side effects. You may report side effects to FDA at 1-800-FDA-1088. Where should I keep my medicine? This drug is given in a hospital or clinic and will not be stored at home. NOTE: This sheet is a summary. It may not cover all possible information. If you have questions about this medicine, talk to your doctor, pharmacist, or health care provider.  2021 Elsevier/Gold Standard (2020-03-12 22:24:55)

## 2021-03-12 NOTE — Progress Notes (Signed)
New Patient Office Visit  Subjective:  Patient ID: Henry Skinner, male    DOB: [birth year]  Age: 75 y.o. MRN: [id number]  CC:  Chief Complaint  Patient presents with  . Puncture Wound    Pt has a puncture wound to left elbow. Pt fell onto a piece of rebar.    HPI [name] presents for for injury to his   He was working in his yard and a piece of re barb he fell on and it jabbed his arm it was not a pointed object. Object was dirty and had been in the ground for 8 years per patient. He denied any foreign body retention.   He fell due to his feet getting tangled in wire. Denies any head trauma.  He reports hemostasis was gained within one minute. Denies any other injury.  Denies any arm pain.  Denies any loss of consciousness.   He is due for tdap  last was 12/2015   Patient  denies any fever, body aches,chills, rash, chest pain, shortness of breath, nausea, vomiting, or diarrhea.  Denies dizziness, lightheadedness, pre syncopal or syncopal episodes.   Past Medical History:  Diagnosis Date  . Chickenpox   . Hypertension   . Kidney stones   . Skin irritation     Past Surgical History:  Procedure Laterality Date  . COLONOSCOPY WITH PROPOFOL N/A 08/30/2019   Procedure: COLONOSCOPY WITH PROPOFOL;  Surgeon: Jonathon Bellows, MD;  Location: [facility] ENDOSCOPY;  Service: Gastroenterology;  Laterality: N/A;  . PAROTIDECTOMY Left 03/04/2016   Procedure: PAROTIDECTOMY;  Surgeon: Beverly Gust, MD;  Location: [facility] ORS;  Service: ENT;  Laterality: Left;  . ROTATOR CUFF REPAIR  May 2016  . SKIN SURGERY     L hand, L arm/shoulder     Family History  Problem Relation Age of Onset  . Alcoholism Brother   . Colon cancer Maternal Uncle     Social History   Socioeconomic History  . Marital status: Single    Spouse name: Not on file  . Number of children: Not on file  . Years of education: Not on file  . Highest education level: Not on file  Occupational History  . Not on file   Tobacco Use  . Smoking status: Never Smoker  . Smokeless tobacco: Never Used  Vaping Use  . Vaping Use: Never used  Substance and Sexual Activity  . Alcohol use: Yes    Alcohol/week: 1.0 standard drink    Types: 1 Standard drinks or equivalent per week    Comment: 1 beer daily  . Drug use: Yes    Types: Marijuana    Comment: marijuana, rarely. Last use 11/2018  . Sexual activity: Yes  Other Topics Concern  . Not on file  Social History Narrative  . Not on file   Social Determinants of Health   Financial Resource Strain: Not on file  Food Insecurity: Not on file  Transportation Needs: Not on file  Physical Activity: Not on file  Stress: Not on file  Social Connections: Not on file  Intimate Partner Violence: Not on file    ROS Review of Systems  Constitutional: Negative.   HENT: Negative.   Respiratory: Negative.   Cardiovascular: Negative.   Genitourinary: Negative.   Musculoskeletal: Negative.   Neurological: Negative.     Objective:   Today's Vitals: BP 140/80 (BP Location: Left Arm, Patient Position: Sitting)   Pulse 79   Temp (!) 96 F (35.6 C)   Ht 5'  7.01" (1.702 m)   Wt 172 lb (78 kg)   SpO2 97%   BMI 26.93 kg/m   Physical Exam Vitals reviewed.  Constitutional:      General: He is not in acute distress.    Appearance: Normal appearance. He is not ill-appearing, toxic-appearing or diaphoretic.  HENT:     Head: Normocephalic and atraumatic.     Right Ear: External ear normal.     Left Ear: External ear normal.     Mouth/Throat:     Mouth: Mucous membranes are moist.  Eyes:     Pupils: Pupils are equal, round, and reactive to light.  Cardiovascular:     Rate and Rhythm: Normal rate and regular rhythm.     Pulses: Normal pulses.     Heart sounds: Normal heart sounds. No murmur heard. No friction rub. No gallop.   Pulmonary:     Effort: Pulmonary effort is normal.     Breath sounds: Normal breath sounds.  Abdominal:     Palpations: Abdomen  is soft.  Musculoskeletal:        General: Normal range of motion.     Cervical back: Normal range of motion and neck supple.  Skin:    General: Skin is warm.     Findings: Signs of injury and wound present.          Comments: Wound rinsed with sterile saline and irrigated for 3 minutes, cleaned area, antibiotic ointment in stock applied to area with gauze bandage, non stick tape and coban wrap applied.  No bleeding at time of wound check, no surrounding erythema.   Neurological:     Motor: No weakness.     Gait: Gait normal.  Psychiatric:        Mood and Affect: Mood normal.        Behavior: Behavior normal.        Thought Content: Thought content normal.        Judgment: Judgment normal.     Assessment & Plan:   Problem List Items Addressed This Visit      Other   Need for Tdap vaccination   Injury - Primary   Relevant Orders   Td : Tetanus/diphtheria >7yo Preservative  free   Puncture wound   Relevant Medications   cephALEXin (KEFLEX) 500 MG capsule     Orders Placed This Encounter  Procedures  . Td : Tetanus/diphtheria >7yo Preservative  free   Tetanus is due as 5 years was 12/2015 had last TDAP, patient is in agreement. Patient declines recommended x ray to verify if any bone injury of arm or any retained foreign body.   Wound care instructions given and when to be seen if any infection or worsening pain.   Will place on Keflex.   Wound recheck in one week advised.  Return in about 1 week (around 03/19/2021), or if symptoms worsen or fail to improve, for at any time for any worsening symptoms, Go to Emergency room/ urgent care if worse.  Outpatient Encounter Medications as of 03/12/2021  Medication Sig  . b complex vitamins tablet Take 1 tablet by mouth daily.  . cephALEXin (KEFLEX) 500 MG capsule Take 1 capsule (500 mg total) by mouth 3 (three) times daily.  Water engineer Bandages & Supports (MEDICAL COMPRESSION THIGH HIGH) MISC Apply in the morning and remove at  bedtime.  Marland Kitchen losartan (COZAAR) 50 MG tablet Take 1 tablet (50 mg total) by mouth daily.  . Omega-3 Fatty Acids (FISH OIL)  500 MG CAPS Take by mouth.  Marland Kitchen Specialty Vitamins Products (PROSTATE PO) Take by mouth. Force Factor Brand  . TURMERIC PO Take 1 tablet by mouth at bedtime.  Marland Kitchen VITAMIN D, CHOLECALCIFEROL, PO Take 2,000 Units by mouth.  . vitamin E 1000 UNIT capsule Take 1,000 Units by mouth daily.   No facility-administered encounter medications on file as of 03/12/2021.    Follow-up: Return in about 1 week (around 03/19/2021), or if symptoms worsen or fail to improve, for at any time for any worsening symptoms, Go to Emergency room/ urgent care if worse.   Marcille Buffy, FNP

## 2021-03-15 ENCOUNTER — Ambulatory Visit: Payer: PPO | Attending: Internal Medicine

## 2021-03-15 ENCOUNTER — Other Ambulatory Visit: Payer: Self-pay

## 2021-03-15 DIAGNOSIS — Z23 Encounter for immunization: Secondary | ICD-10-CM

## 2021-03-15 MED ORDER — PFIZER-BIONT COVID-19 VAC-TRIS 30 MCG/0.3ML IM SUSP
INTRAMUSCULAR | 0 refills | Status: DC
  Filled 2021-03-15: qty 0.3, 1d supply, fill #0

## 2021-03-15 NOTE — Progress Notes (Signed)
   Covid-19 Vaccination Clinic  Name:  Kinley Ferrentino    MRN: [id number] DOB: 05/17/1946  03/15/2021  Mr. Grigg was observed post Covid-19 immunization for 15 minutes without incident. He was provided with Vaccine Information Sheet and instruction to access the V-Safe system.   Mr. Lince was instructed to call 911 with any severe reactions post vaccine: Marland Kitchen Difficulty breathing  . Swelling of face and throat  . A fast heartbeat  . A bad rash all over body  . Dizziness and weakness   Immunizations Administered    Name Date Dose VIS Date Route   PFIZER Comrnaty(Gray TOP) Covid-19 Vaccine 03/15/2021  1:07 PM 0.3 mL 10/25/2020 Intramuscular   Manufacturer: [place]   Lot: [id number]   NDC: [address]-2     2

## 2021-03-26 ENCOUNTER — Ambulatory Visit (INDEPENDENT_AMBULATORY_CARE_PROVIDER_SITE_OTHER): Payer: PPO

## 2021-03-26 ENCOUNTER — Other Ambulatory Visit: Payer: Self-pay

## 2021-03-26 VITALS — BP 131/75 | HR 72 | Temp 98.0°F | Resp 16 | Ht 67.01 in | Wt 172.1 lb

## 2021-03-26 DIAGNOSIS — Z0000 Encounter for general adult medical examination without abnormal findings: Secondary | ICD-10-CM

## 2021-03-26 NOTE — Patient Instructions (Addendum)
[name] , Thank you for taking time to come for your Medicare Wellness Visit. I appreciate your ongoing commitment to your health goals. Please review the following plan we discussed and let me know if I can assist you in the future.   These are the goals we discussed: Goals      Patient Stated   .  DIET - REDUCE SUGAR INTAKE (pt-stated)      Continue reducing sugar intake; monitor diet    .  Increase physical activity (pt-stated)      Walk more for exercise        This is a list of the screening recommended for you and due dates:  Health Maintenance  Topic Date Due  . Flu Shot  06/17/2021  . Colon Cancer Screening  08/29/2029  . Tetanus Vaccine  03/13/2031  . COVID-19 Vaccine  Completed  . Hepatitis C Screening: USPSTF Recommendation to screen - Ages 60-79 yo.  Completed  . Pneumonia vaccines  Completed  . HPV Vaccine  Aged Out   Advanced directives: not yet completed  Conditions/risks identified: none new  Follow up in one year for your annual wellness visit.   Preventive Care 12 Years and Older, Male Preventive care refers to lifestyle choices and visits with your health care provider that can promote health and wellness. What does preventive care include?  A yearly physical exam. This is also called an annual well check.  Dental exams once or twice a year.  Routine eye exams. Ask your health care provider how often you should have your eyes checked.  Personal lifestyle choices, including:  Daily care of your teeth and gums.  Regular physical activity.  Eating a healthy diet.  Avoiding tobacco and drug use.  Limiting alcohol use.  Practicing safe sex.  Taking low doses of aspirin every day.  Taking vitamin and mineral supplements as recommended by your health care provider. What happens during an annual well check? The services and screenings done by your health care provider during your annual well check will depend on your age, overall health,  lifestyle risk factors, and family history of disease. Counseling  Your health care provider may ask you questions about your:  Alcohol use.  Tobacco use.  Drug use.  Emotional well-being.  Home and relationship well-being.  Sexual activity.  Eating habits.  History of falls.  Memory and ability to understand (cognition).  Work and work Statistician. Screening  You may have the following tests or measurements:  Height, weight, and BMI.  Blood pressure.  Lipid and cholesterol levels. These may be checked every 5 years, or more frequently if you are over 60 years old.  Skin check.  Lung cancer screening. You may have this screening every year starting at age 80 if you have a 30-pack-year history of smoking and currently smoke or have quit within the past 15 years.  Fecal occult blood test (FOBT) of the stool. You may have this test every year starting at age 53.  Flexible sigmoidoscopy or colonoscopy. You may have a sigmoidoscopy every 5 years or a colonoscopy every 10 years starting at age 59.  Prostate cancer screening. Recommendations will vary depending on your family history and other risks.  Hepatitis C blood test.  Hepatitis B blood test.  Sexually transmitted disease (STD) testing.  Diabetes screening. This is done by checking your blood sugar (glucose) after you have not eaten for a while (fasting). You may have this done every 1-3 years.  Abdominal  aortic aneurysm (AAA) screening. You may need this if you are a current or former smoker.  Osteoporosis. You may be screened starting at age 73 if you are at high risk. Talk with your health care provider about your test results, treatment options, and if necessary, the need for more tests. Vaccines  Your health care provider may recommend certain vaccines, such as:  Influenza vaccine. This is recommended every year.  Tetanus, diphtheria, and acellular pertussis (Tdap, Td) vaccine. You may need a Td booster  every 10 years.  Zoster vaccine. You may need this after age 76.  Pneumococcal 13-valent conjugate (PCV13) vaccine. One dose is recommended after age 82.  Pneumococcal polysaccharide (PPSV23) vaccine. One dose is recommended after age 26. Talk to your health care provider about which screenings and vaccines you need and how often you need them. This information is not intended to replace advice given to you by your health care provider. Make sure you discuss any questions you have with your health care provider. Document Released: 11/30/2015 Document Revised: 07/23/2016 Document Reviewed: 09/04/2015 Elsevier Interactive Patient Education  2017 [place] Prevention in the Home Falls can cause injuries. They can happen to people of all ages. There are many things you can do to make your home safe and to help prevent falls. What can I do on the outside of my home?  Regularly fix the edges of walkways and driveways and fix any cracks.  Remove anything that might make you trip as you walk through a door, such as a raised step or threshold.  Trim any bushes or trees on the path to your home.  Use bright outdoor lighting.  Clear any walking paths of anything that might make someone trip, such as rocks or tools.  Regularly check to see if handrails are loose or broken. Make sure that both sides of any steps have handrails.  Any raised decks and porches should have guardrails on the edges.  Have any leaves, snow, or ice cleared regularly.  Use sand or salt on walking paths during winter.  Clean up any spills in your garage right away. This includes oil or grease spills. What can I do in the bathroom?  Use night lights.  Install grab bars by the toilet and in the tub and shower. Do not use towel bars as grab bars.  Use non-skid mats or decals in the tub or shower.  If you need to sit down in the shower, use a plastic, non-slip stool.  Keep the floor dry. Clean up any  water that spills on the floor as soon as it happens.  Remove soap buildup in the tub or shower regularly.  Attach bath mats securely with double-sided non-slip rug tape.  Do not have throw rugs and other things on the floor that can make you trip. What can I do in the bedroom?  Use night lights.  Make sure that you have a light by your bed that is easy to reach.  Do not use any sheets or blankets that are too big for your bed. They should not hang down onto the floor.  Have a firm chair that has side arms. You can use this for support while you get dressed.  Do not have throw rugs and other things on the floor that can make you trip. What can I do in the kitchen?  Clean up any spills right away.  Avoid walking on wet floors.  Keep items that you use a  lot in easy-to-reach places.  If you need to reach something above you, use a strong step stool that has a grab bar.  Keep electrical cords out of the way.  Do not use floor polish or wax that makes floors slippery. If you must use wax, use non-skid floor wax.  Do not have throw rugs and other things on the floor that can make you trip. What can I do with my stairs?  Do not leave any items on the stairs.  Make sure that there are handrails on both sides of the stairs and use them. Fix handrails that are broken or loose. Make sure that handrails are as long as the stairways.  Check any carpeting to make sure that it is firmly attached to the stairs. Fix any carpet that is loose or worn.  Avoid having throw rugs at the top or bottom of the stairs. If you do have throw rugs, attach them to the floor with carpet tape.  Make sure that you have a light switch at the top of the stairs and the bottom of the stairs. If you do not have them, ask someone to add them for you. What else can I do to help prevent falls?  Wear shoes that:  Do not have high heels.  Have rubber bottoms.  Are comfortable and fit you well.  Are closed  at the toe. Do not wear sandals.  If you use a stepladder:  Make sure that it is fully opened. Do not climb a closed stepladder.  Make sure that both sides of the stepladder are locked into place.  Ask someone to hold it for you, if possible.  Clearly mark and make sure that you can see:  Any grab bars or handrails.  First and last steps.  Where the edge of each step is.  Use tools that help you move around (mobility aids) if they are needed. These include:  Canes.  Walkers.  Scooters.  Crutches.  Turn on the lights when you go into a dark area. Replace any light bulbs as soon as they burn out.  Set up your furniture so you have a clear path. Avoid moving your furniture around.  If any of your floors are uneven, fix them.  If there are any pets around you, be aware of where they are.  Review your medicines with your doctor. Some medicines can make you feel dizzy. This can increase your chance of falling. Ask your doctor what other things that you can do to help prevent falls. This information is not intended to replace advice given to you by your health care provider. Make sure you discuss any questions you have with your health care provider. Document Released: 08/30/2009 Document Revised: 04/10/2016 Document Reviewed: 12/08/2014 Elsevier Interactive Patient Education  2017 [employer].

## 2021-03-26 NOTE — Progress Notes (Signed)
Subjective:   Jorian Willhoite is a 75 y.o. male who presents for Medicare Annual/Subsequent preventive examination.  Review of Systems    No ROS.  Medicare Wellness    Cardiac Risk Factors include: advanced age (>42men, >27 women);male gender;hypertension     Objective:    Today's Vitals   03/26/21 1315  BP: 131/75  Pulse: 72  Resp: 16  Temp: 98 F (36.7 C)  TempSrc: Oral  SpO2: 97%  Weight: 172 lb 1.6 oz (78.1 kg)  Height: 5' 7.01" (1.702 m)   Body mass index is 26.95 kg/m.  Advanced Directives 03/26/2021 03/23/2020 02/01/2020 08/30/2019 03/23/2019 03/19/2018 03/18/2017  Does Patient Have a Medical Advance Directive? No No No Yes No Yes No  Type of Advance Directive - - - Living will - Living will;Healthcare Power of Attorney -  Does patient want to make changes to medical advance directive? - - - - - No - Patient declined -  Copy of Reserve in Chart? - - - - - No - copy requested -  Would patient like information on creating a medical advance directive? No - Patient declined Yes (MAU/Ambulatory/Procedural Areas - Information given) - - No - Patient declined - Yes (MAU/Ambulatory/Procedural Areas - Information given)    Current Medications (verified) Outpatient Encounter Medications as of 03/26/2021  Medication Sig  . b complex vitamins tablet Take 1 tablet by mouth daily.  Marland Kitchen COVID-19 mRNA Vac-TriS, Pfizer, (PFIZER-BIONT COVID-19 VAC-TRIS) SUSP injection Inject into the muscle.  Water engineer Bandages & Supports (MEDICAL COMPRESSION THIGH HIGH) MISC Apply in the morning and remove at bedtime.  Marland Kitchen losartan (COZAAR) 50 MG tablet Take 1 tablet (50 mg total) by mouth daily.  . Omega-3 Fatty Acids (FISH OIL) 500 MG CAPS Take by mouth.  Marland Kitchen Specialty Vitamins Products (PROSTATE PO) Take by mouth. Force Factor Brand  . TURMERIC PO Take 1 tablet by mouth at bedtime.  Marland Kitchen VITAMIN D, CHOLECALCIFEROL, PO Take 2,000 Units by mouth.  . vitamin E 1000 UNIT capsule Take 1,000  Units by mouth daily.  . [DISCONTINUED] cephALEXin (KEFLEX) 500 MG capsule Take 1 capsule (500 mg total) by mouth 3 (three) times daily.   No facility-administered encounter medications on file as of 03/26/2021.    Allergies (verified) Patient has no known allergies.   History: Past Medical History:  Diagnosis Date  . Chickenpox   . Hypertension   . Kidney stones   . Skin irritation    Past Surgical History:  Procedure Laterality Date  . COLONOSCOPY WITH PROPOFOL N/A 08/30/2019   Procedure: COLONOSCOPY WITH PROPOFOL;  Surgeon: Jonathon Bellows, MD;  Location: [facility] ENDOSCOPY;  Service: Gastroenterology;  Laterality: N/A;  . PAROTIDECTOMY Left 03/04/2016   Procedure: PAROTIDECTOMY;  Surgeon: Beverly Gust, MD;  Location: [facility] ORS;  Service: ENT;  Laterality: Left;  . ROTATOR CUFF REPAIR  May 2016  . SKIN SURGERY     L hand, L arm/shoulder    Family History  Problem Relation Age of Onset  . Alcoholism Brother   . Colon cancer Maternal Uncle    Social History   Socioeconomic History  . Marital status: Single    Spouse name: Not on file  . Number of children: Not on file  . Years of education: Not on file  . Highest education level: Not on file  Occupational History  . Not on file  Tobacco Use  . Smoking status: Never Smoker  . Smokeless tobacco: Never Used  Vaping Use  .  Vaping Use: Never used  Substance and Sexual Activity  . Alcohol use: Yes    Alcohol/week: 1.0 standard drink    Types: 1 Standard drinks or equivalent per week    Comment: 1 beer daily  . Drug use: Yes    Types: Marijuana    Comment: marijuana, rarely. Last use 11/2018  . Sexual activity: Yes  Other Topics Concern  . Not on file  Social History Narrative  . Not on file   Social Determinants of Health   Financial Resource Strain: Low Risk   . Difficulty of Paying Living Expenses: Not hard at all  Food Insecurity: No Food Insecurity  . Worried About Charity fundraiser in the Last Year: Never  true  . Ran Out of Food in the Last Year: Never true  Transportation Needs: No Transportation Needs  . Lack of Transportation (Medical): No  . Lack of Transportation (Non-Medical): No  Physical Activity: Insufficiently Active  . Days of Exercise per Week: 4 days  . Minutes of Exercise per Session: 30 min  Stress: No Stress Concern Present  . Feeling of Stress : Not at all  Social Connections: Unknown  . Frequency of Communication with Friends and Family: More than three times a week  . Frequency of Social Gatherings with Friends and Family: More than three times a week  . Attends Religious Services: Not on file  . Active Member of Clubs or Organizations: Not on file  . Attends Archivist Meetings: Not on file  . Marital Status: Not on file    Tobacco Counseling Counseling given: Not Answered   Clinical Intake:  Pre-visit preparation completed: Yes        Diabetes: No  How often do you need to have someone help you when you read instructions, pamphlets, or other written materials from your doctor or pharmacy?: 1 - Never    Interpreter Needed?: No      Activities of Daily Living In your present state of health, do you have any difficulty performing the following activities: 03/26/2021  Hearing? N  Vision? N  Difficulty concentrating or making decisions? N  Walking or climbing stairs? N  Dressing or bathing? N  Doing errands, shopping? N  Preparing Food and eating ? N  Using the Toilet? N  In the past six months, have you accidently leaked urine? N  Do you have problems with loss of bowel control? N  Managing your Medications? N  Managing your Finances? N  Housekeeping or managing your Housekeeping? N  Some recent data might be hidden    Patient Care Team: Leone Haven, MD as PCP - General (Family Medicine)  Indicate any recent Medical Services you may have received from other than [facility] providers in the past year (date may be  approximate).     Assessment:   This is a routine wellness examination for Jabarri.  Hearing/Vision screen  Hearing Screening   125Hz  250Hz  500Hz  1000Hz  2000Hz  3000Hz  4000Hz  6000Hz  8000Hz   Right ear:           Left ear:           Comments: Patient is able to hear conversational tones without difficulty.  No issues reported.  Vision Screening Comments: 20/50 without correction Visual acuity not assessed, virtual visit.   Dietary issues and exercise activities discussed: Current Exercise Habits: Home exercise routine, Intensity: Mild  Regular diet Good water intake  Goals Addressed  This Visit's Progress     Patient Stated   .  COMPLETED: Follow up with PCP as needed (pt-stated)      .  Increase physical activity (pt-stated)        Walk more for exercise       Depression Screen PHQ 2/9 Scores 03/26/2021 03/12/2021 10/08/2020 03/23/2020 02/24/2020 08/24/2019 03/23/2019  PHQ - 2 Score 0 0 0 0 0 0 0  PHQ- 9 Score - - - - - - -    Fall Risk Fall Risk  03/26/2021 03/12/2021 11/15/2020 10/08/2020 07/06/2020  Falls in the past year? 1 1 0 0 0  Number falls in past yr: - 0 0 0 0  Injury with Fall? - 1 0 - -  Follow up Falls evaluation completed Falls evaluation completed Falls evaluation completed Falls evaluation completed Falls evaluation completed    [place]: Handrails in use when climbing stairs? Yes Home free of loose throw rugs in walkways, pet beds, electrical cords, etc? Yes  Adequate lighting in your home to reduce risk of falls? Yes   ASSISTIVE DEVICES UTILIZED TO PREVENT FALLS: Life alert? No  Use of a cane, walker or w/c? No  Grab bars in the bathroom? No  Shower chair or bench in shower? No  Elevated toilet seat or a handicapped toilet? No   TIMED UP AND GO: Was the test performed? Yes .  Length of time to ambulate 10 feet: 10 sec.   Gait steady and fast without use of assistive device  Cognitive Function: MMSE  - Mini Mental State Exam 03/19/2018 03/18/2017  Orientation to time 5 5  Orientation to Place 5 5  Registration 3 3  Attention/ Calculation 5 5  Recall 3 3  Language- name 2 objects 2 2  Language- repeat 1 1  Language- follow 3 step command 3 3  Language- read & follow direction 1 1  Write a sentence 1 1  Copy design 1 1  Total score 30 30     6CIT Screen 03/26/2021 03/23/2020 03/23/2019  What Year? 0 points 0 points 0 points  What month? 0 points 0 points 0 points  What time? 0 points 0 points 0 points  Count back from 20 0 points 0 points 0 points  Months in reverse 0 points 0 points 0 points  Repeat phrase 0 points 0 points 0 points  Total Score 0 0 0    Immunizations Immunization History  Administered Date(s) Administered  . Influenza, High Dose Seasonal PF 11/09/2015, 09/10/2016, 08/19/2017, 08/09/2018, 07/05/2019, 07/05/2019, 07/31/2020  . Influenza,inj,quad, With Preservative 08/22/2014  . Influenza-Unspecified 10/20/2016  . PFIZER Comirnaty(Gray Top)Covid-19 Tri-Sucrose Vaccine 03/15/2021  . PFIZER(Purple Top)SARS-COV-2 Vaccination 12/27/2019, 01/17/2020, 08/14/2020  . Pneumococcal Conjugate-13 12/25/2016  . Pneumococcal Polysaccharide-23 03/19/2018  . Td 03/12/2021  . Tdap 01/08/2016  . Zoster 12/18/2006  . Zoster Recombinat (Shingrix) 06/08/2019, 09/23/2019    Health Maintenance There are no preventive care reminders to display for this patient. Health Maintenance  Topic Date Due  . INFLUENZA VACCINE  06/17/2021  . COLONOSCOPY (Pts 45-35yrs Insurance coverage will need to be confirmed)  08/29/2029  . TETANUS/TDAP  03/13/2031  . COVID-19 Vaccine  Completed  . Hepatitis C Screening  Completed  . PNA vac Low Risk Adult  Completed  . HPV VACCINES  Aged Out   Lung Cancer Screening: (Low Dose CT Chest recommended if Age 59-80 years, 30 pack-year currently smoking OR have quit w/in 15years.) does not qualify.  Dental Screening: Recommended annual dental exams for  proper oral hygiene.  Community Resource Referral / Chronic Care Management: CRR required this visit?  No   CCM required this visit?  No      Plan:    Keep all routine maintenance appointments.   Follow up 04/30/21  I have personally reviewed and noted the following in the patient's chart:   . Medical and social history . Use of alcohol, tobacco or illicit drugs  . Current medications and supplements including opioid prescriptions. Patient is not currently taking opioid prescriptions. . Functional ability and status . Nutritional status . Physical activity . Advanced directives . List of other physicians . Hospitalizations, surgeries, and ER visits in previous 12 months . Vitals . Screenings to include cognitive, depression, and falls . Referrals and appointments  In addition, I have reviewed and discussed with patient certain preventive protocols, quality metrics, and best practice recommendations. A written personalized care plan for preventive services as well as general preventive health recommendations were provided to patient.     Varney Biles, LPN   [phone]

## 2021-04-30 ENCOUNTER — Encounter: Payer: Self-pay | Admitting: Family Medicine

## 2021-04-30 ENCOUNTER — Ambulatory Visit (INDEPENDENT_AMBULATORY_CARE_PROVIDER_SITE_OTHER): Payer: PPO | Admitting: Family Medicine

## 2021-04-30 ENCOUNTER — Other Ambulatory Visit: Payer: Self-pay

## 2021-04-30 DIAGNOSIS — E663 Overweight: Secondary | ICD-10-CM | POA: Diagnosis not present

## 2021-04-30 DIAGNOSIS — I10 Essential (primary) hypertension: Secondary | ICD-10-CM | POA: Diagnosis not present

## 2021-04-30 LAB — COMPREHENSIVE METABOLIC PANEL
ALT: 17 U/L (ref 0–53)
AST: 16 U/L (ref 0–37)
Albumin: 4.2 g/dL (ref 3.5–5.2)
Alkaline Phosphatase: 61 U/L (ref 39–117)
BUN: 21 mg/dL (ref 6–23)
CO2: 31 mEq/L (ref 19–32)
Calcium: 9.1 mg/dL (ref 8.4–10.5)
Chloride: 102 mEq/L (ref 96–112)
Creatinine, Ser: 1.3 mg/dL (ref 0.40–1.50)
GFR: 54.69 mL/min — ABNORMAL LOW (ref >60.00)
Glucose, Bld: 94 mg/dL (ref 70–99)
Potassium: 4.7 mEq/L (ref 3.5–5.1)
Sodium: 138 mEq/L (ref 135–145)
Total Bilirubin: 0.8 mg/dL (ref 0.2–1.2)
Total Protein: 6.7 g/dL (ref 6.0–8.3)

## 2021-04-30 LAB — LIPID PANEL
Cholesterol: 144 mg/dL (ref 0–200)
HDL: 45.8 mg/dL (ref >39.00)
LDL Cholesterol: 86 mg/dL (ref 0–99)
NonHDL: 98.64
Total CHOL/HDL Ratio: 3
Triglycerides: 62 mg/dL (ref 0.0–149.0)
VLDL: 12.4 mg/dL (ref 0.0–40.0)

## 2021-04-30 NOTE — Assessment & Plan Note (Signed)
Continue healthy diet.  Discussed continuing with exercise.

## 2021-04-30 NOTE — Assessment & Plan Note (Signed)
Continue losartan 50 mg daily.  Check labs.

## 2021-04-30 NOTE — Patient Instructions (Signed)
Nice to see you. Please continue with healthy diet and continued exercise. Please continue with losartan 50 mg once daily.

## 2021-04-30 NOTE — Progress Notes (Signed)
  Tommi Rumps, MD Phone: [phone]  Henry Skinner is a 75 y.o. male who presents today for f/u.  HYPERTENSION Disease Monitoring Home BP Monitoring not checking Chest pain- no    Dyspnea- no Medications Compliance-  taking losartan 50 mg daily.  Edema- no  Overweight: Patient has been walking 1.5 miles 4-5 times a week.  Has been eating lots of vegetables.  30% of his diet is meats.  He has 1 soda per month.  1 beer per day.   Social History   Tobacco Use  Smoking Status Never  Smokeless Tobacco Never    Current Outpatient Medications on File Prior to Visit  Medication Sig Dispense Refill   b complex vitamins tablet Take 1 tablet by mouth daily.     COVID-19 mRNA Vac-TriS, Pfizer, (PFIZER-BIONT COVID-19 VAC-TRIS) SUSP injection Inject into the muscle. 0.3 mL 0   Elastic Bandages & Supports (MEDICAL COMPRESSION THIGH HIGH) MISC Apply in the morning and remove at bedtime. 2 each 3   losartan (COZAAR) 50 MG tablet Take 1 tablet (50 mg total) by mouth daily. 90 tablet 3   Omega-3 Fatty Acids (FISH OIL) 500 MG CAPS Take by mouth.     Specialty Vitamins Products (PROSTATE PO) Take by mouth. Force Factor Brand     TURMERIC PO Take 1 tablet by mouth at bedtime.     VITAMIN D, CHOLECALCIFEROL, PO Take 2,000 Units by mouth.     vitamin E 1000 UNIT capsule Take 1,000 Units by mouth daily.     No current facility-administered medications on file prior to visit.     ROS see history of present illness  Objective  Physical Exam Vitals:   04/30/21 1003 04/30/21 1021  BP: 140/70 120/80  Pulse: 66   Temp: 98.3 F (36.8 C)   SpO2: 97%     BP Readings from Last 3 Encounters:  04/30/21 120/80  03/26/21 131/75  03/12/21 140/80   Wt Readings from Last 3 Encounters:  04/30/21 170 lb (77.1 kg)  03/26/21 172 lb 1.6 oz (78.1 kg)  03/12/21 172 lb (78 kg)    Physical Exam Constitutional:      General: He is not in acute distress.    Appearance: He is not diaphoretic.   Cardiovascular:     Rate and Rhythm: Normal rate and regular rhythm.     Heart sounds: Normal heart sounds.  Pulmonary:     Effort: Pulmonary effort is normal.     Breath sounds: Normal breath sounds.  Skin:    General: Skin is warm and dry.  Neurological:     Mental Status: He is alert.     Assessment/Plan: Please see individual problem list.  Problem List Items Addressed This Visit     Overweight    Continue healthy diet.  Discussed continuing with exercise.       Relevant Orders   Lipid panel   Essential hypertension    Continue losartan 50 mg daily.  Check labs.       Relevant Orders   Comp Met (CMET)   Lipid panel    Return in about 6 months (around 10/30/2021) for HTN with labs.  This visit occurred during the SARS-CoV-2 public health emergency.  Safety protocols were in place, including screening questions prior to the visit, additional usage of staff PPE, and extensive cleaning of exam room while observing appropriate contact time as indicated for disinfecting solutions.    Tommi Rumps, MD [place]

## 2021-05-02 NOTE — Progress Notes (Signed)
VM is full.  Henry Skinner,cma

## 2021-05-02 NOTE — Progress Notes (Signed)
VM full.  Cricket Goodlin,cma

## 2021-05-10 ENCOUNTER — Telehealth: Payer: Self-pay

## 2021-05-10 DIAGNOSIS — N179 Acute kidney failure, unspecified: Secondary | ICD-10-CM

## 2021-05-10 NOTE — Telephone Encounter (Signed)
Lab results was mailed to the patient and he wanted me to explain his labs and we went over them. Henry Skinner,cma

## 2021-05-12 NOTE — Telephone Encounter (Signed)
Noted. His kidney function should be rechecked this week. Order placed. He should avoid NSAIDs.

## 2021-05-13 NOTE — Telephone Encounter (Signed)
I called and spoke with the patient and scheduled the patient for a lab recheck this week

## 2021-05-17 ENCOUNTER — Other Ambulatory Visit (INDEPENDENT_AMBULATORY_CARE_PROVIDER_SITE_OTHER): Payer: PPO

## 2021-05-17 ENCOUNTER — Other Ambulatory Visit: Payer: Self-pay

## 2021-05-17 DIAGNOSIS — N179 Acute kidney failure, unspecified: Secondary | ICD-10-CM | POA: Diagnosis not present

## 2021-05-17 LAB — BASIC METABOLIC PANEL
BUN: 16 mg/dL (ref 6–23)
CO2: 28 mEq/L (ref 19–32)
Calcium: 9.4 mg/dL (ref 8.4–10.5)
Chloride: 103 mEq/L (ref 96–112)
Creatinine, Ser: 0.94 mg/dL (ref 0.40–1.50)
GFR: 80.67 mL/min (ref >60.00)
Glucose, Bld: 93 mg/dL (ref 70–99)
Potassium: 3.8 mEq/L (ref 3.5–5.1)
Sodium: 138 mEq/L (ref 135–145)

## 2021-05-28 DIAGNOSIS — C44519 Basal cell carcinoma of skin of other part of trunk: Secondary | ICD-10-CM | POA: Diagnosis not present

## 2021-05-28 DIAGNOSIS — D485 Neoplasm of uncertain behavior of skin: Secondary | ICD-10-CM | POA: Diagnosis not present

## 2021-07-12 DIAGNOSIS — L570 Actinic keratosis: Secondary | ICD-10-CM | POA: Diagnosis not present

## 2021-07-12 DIAGNOSIS — C44519 Basal cell carcinoma of skin of other part of trunk: Secondary | ICD-10-CM | POA: Diagnosis not present

## 2021-08-22 ENCOUNTER — Other Ambulatory Visit: Payer: Self-pay

## 2021-08-27 DIAGNOSIS — Z85828 Personal history of other malignant neoplasm of skin: Secondary | ICD-10-CM | POA: Diagnosis not present

## 2021-08-27 DIAGNOSIS — D0461 Carcinoma in situ of skin of right upper limb, including shoulder: Secondary | ICD-10-CM | POA: Diagnosis not present

## 2021-08-27 DIAGNOSIS — L821 Other seborrheic keratosis: Secondary | ICD-10-CM | POA: Diagnosis not present

## 2021-08-27 DIAGNOSIS — D485 Neoplasm of uncertain behavior of skin: Secondary | ICD-10-CM | POA: Diagnosis not present

## 2021-08-27 DIAGNOSIS — L570 Actinic keratosis: Secondary | ICD-10-CM | POA: Diagnosis not present

## 2021-08-27 DIAGNOSIS — Q828 Other specified congenital malformations of skin: Secondary | ICD-10-CM | POA: Diagnosis not present

## 2021-08-27 DIAGNOSIS — Z08 Encounter for follow-up examination after completed treatment for malignant neoplasm: Secondary | ICD-10-CM | POA: Diagnosis not present

## 2021-08-27 DIAGNOSIS — X32XXXA Exposure to sunlight, initial encounter: Secondary | ICD-10-CM | POA: Diagnosis not present

## 2021-08-27 DIAGNOSIS — D2272 Melanocytic nevi of left lower limb, including hip: Secondary | ICD-10-CM | POA: Diagnosis not present

## 2021-09-06 ENCOUNTER — Other Ambulatory Visit: Payer: Self-pay

## 2021-09-06 ENCOUNTER — Ambulatory Visit: Payer: PPO | Attending: Internal Medicine

## 2021-09-06 DIAGNOSIS — Z23 Encounter for immunization: Secondary | ICD-10-CM

## 2021-09-06 MED ORDER — PFIZER COVID-19 VAC BIVALENT 30 MCG/0.3ML IM SUSP
INTRAMUSCULAR | 0 refills | Status: DC
Start: 2021-09-06 — End: 2021-11-05
  Filled 2021-09-06: qty 0.3, 1d supply, fill #0

## 2021-09-06 NOTE — Progress Notes (Signed)
   Covid-19 Vaccination Clinic  Name:  Henry Skinner    MRN: [id number] DOB: 1946-02-21  09/06/2021  Henry Skinner was observed post Covid-19 immunization for 15 minutes without incident. He was provided with Vaccine Information Sheet and instruction to access the V-Safe system.   Henry Skinner was instructed to call 911 with any severe reactions post vaccine: Difficulty breathing  Swelling of face and throat  A fast heartbeat  A bad rash all over body  Dizziness and weakness   Immunizations Administered     Name Date Dose VIS Date Route   Pfizer Covid-19 Vaccine Bivalent Booster 09/06/2021 10:22 AM 0.3 mL 07/17/2021 Intramuscular   Manufacturer: Crystal   Lot: [place]: [id number]      Lu Duffel, PharmD, MBA Clinical Acute Care Pharmacist

## 2021-09-20 ENCOUNTER — Other Ambulatory Visit: Payer: Self-pay | Admitting: Family Medicine

## 2021-09-20 DIAGNOSIS — I10 Essential (primary) hypertension: Secondary | ICD-10-CM

## 2021-10-25 DIAGNOSIS — D0461 Carcinoma in situ of skin of right upper limb, including shoulder: Secondary | ICD-10-CM | POA: Diagnosis not present

## 2021-10-25 DIAGNOSIS — L570 Actinic keratosis: Secondary | ICD-10-CM | POA: Diagnosis not present

## 2021-11-05 ENCOUNTER — Ambulatory Visit (INDEPENDENT_AMBULATORY_CARE_PROVIDER_SITE_OTHER): Payer: PPO | Admitting: Family Medicine

## 2021-11-05 ENCOUNTER — Other Ambulatory Visit: Payer: Self-pay

## 2021-11-05 ENCOUNTER — Encounter: Payer: Self-pay | Admitting: Family Medicine

## 2021-11-05 VITALS — BP 120/60 | HR 67 | Temp 98.6°F | Ht 67.0 in | Wt 170.8 lb

## 2021-11-05 DIAGNOSIS — I10 Essential (primary) hypertension: Secondary | ICD-10-CM

## 2021-11-05 DIAGNOSIS — R7303 Prediabetes: Secondary | ICD-10-CM

## 2021-11-05 DIAGNOSIS — R350 Frequency of micturition: Secondary | ICD-10-CM

## 2021-11-05 DIAGNOSIS — N401 Enlarged prostate with lower urinary tract symptoms: Secondary | ICD-10-CM | POA: Diagnosis not present

## 2021-11-05 LAB — BASIC METABOLIC PANEL
BUN: 17 mg/dL (ref 6–23)
CO2: 31 mEq/L (ref 19–32)
Calcium: 9.5 mg/dL (ref 8.4–10.5)
Chloride: 104 mEq/L (ref 96–112)
Creatinine, Ser: 0.9 mg/dL (ref 0.40–1.50)
GFR: 84.71 mL/min (ref >60.00)
Glucose, Bld: 99 mg/dL (ref 70–99)
Potassium: 4 mEq/L (ref 3.5–5.1)
Sodium: 140 mEq/L (ref 135–145)

## 2021-11-05 LAB — HEMOGLOBIN A1C: Hgb A1c MFr Bld: 5.7 % (ref 4.6–6.5)

## 2021-11-05 NOTE — Assessment & Plan Note (Signed)
Well-controlled.  He will continue losartan 50 mg once daily.  Check BMP.

## 2021-11-05 NOTE — Assessment & Plan Note (Signed)
The patient will continue with diet and exercise.  We will check an A1c today.

## 2021-11-05 NOTE — Progress Notes (Signed)
Tommi Rumps, MD Phone: [phone]  Henry Skinner is a 75 y.o. male who presents today for f/u.  HYPERTENSION Disease Monitoring Home BP Monitoring not checking Chest pain- no    Dyspnea- no Medications Compliance-  taking losartan.   Edema- no BMET    Component Value Date/Time   NA 138 05/17/2021 1352   NA 141 09/11/2020 1103   K 3.8 05/17/2021 1352   CL 103 05/17/2021 1352   CO2 28 05/17/2021 1352   GLUCOSE 93 05/17/2021 1352   BUN 16 05/17/2021 1352   BUN 14 09/11/2020 1103   CREATININE 0.94 05/17/2021 1352   CREATININE 0.95 02/24/2020 1442   CALCIUM 9.4 05/17/2021 1352   GFRNONAA 91 09/11/2020 1103   GFRAA 105 09/11/2020 1103   Prediabetes: No polyuria or polydipsia.  He is eating less meat.  More vegetables.  He has backed off on his sugar.  He exercises by walking 3-4 times a week.  BPH: Patient has been taking super beta prostate.  The bottle says to take to once a day though he has been taking 2 in the morning and 1 at night.  He notes his urine function and seems normal.  Social History   Tobacco Use  Smoking Status Never  Smokeless Tobacco Never    Current Outpatient Medications on File Prior to Visit  Medication Sig Dispense Refill   b complex vitamins tablet Take 1 tablet by mouth daily.     Elastic Bandages & Supports (MEDICAL COMPRESSION THIGH HIGH) MISC Apply in the morning and remove at bedtime. 2 each 3   losartan (COZAAR) 50 MG tablet TAKE 1 TABLET BY MOUTH EVERY DAY 90 tablet 3   Omega-3 Fatty Acids (FISH OIL) 500 MG CAPS Take by mouth.     Specialty Vitamins Products (PROSTATE PO) Take by mouth. Force Factor Brand     TURMERIC PO Take 1 tablet by mouth at bedtime.     VITAMIN D, CHOLECALCIFEROL, PO Take 2,000 Units by mouth.     vitamin E 1000 UNIT capsule Take 1,000 Units by mouth daily.     No current facility-administered medications on file prior to visit.     ROS see history of present illness  Objective  Physical Exam Vitals:    11/05/21 1031  BP: 120/60  Pulse: 67  Temp: 98.6 F (37 C)  SpO2: 99%    BP Readings from Last 3 Encounters:  11/05/21 120/60  04/30/21 120/80  03/26/21 131/75   Wt Readings from Last 3 Encounters:  11/05/21 170 lb 12.8 oz (77.5 kg)  04/30/21 170 lb (77.1 kg)  03/26/21 172 lb 1.6 oz (78.1 kg)    Physical Exam Constitutional:      General: He is not in acute distress.    Appearance: He is not diaphoretic.  Cardiovascular:     Rate and Rhythm: Normal rate and regular rhythm.     Heart sounds: Normal heart sounds.  Pulmonary:     Effort: Pulmonary effort is normal.     Breath sounds: Normal breath sounds.  Skin:    General: Skin is warm and dry.  Neurological:     Mental Status: He is alert.     Assessment/Plan: Please see individual problem list.  Problem List Items Addressed This Visit     BPH (benign prostatic hyperplasia)    Reports good control.  Discussed that supplements are not FDA regulated and its difficult to tell how they would affect him long-term.  Advised if he is going  to take a supplement he needs to follow the instructions on the bottle.      Essential hypertension - Primary    Well-controlled.  He will continue losartan 50 mg once daily.  Check BMP.      Relevant Orders   Basic Metabolic Panel (BMET)   Comp Met (CMET)   Lipid panel   Prediabetes    The patient will continue with diet and exercise.  We will check an A1c today.      Relevant Orders   HgB A1c   Hemoglobin A1c     Return in about 6 months (around 05/06/2022) for Hypertension, labs 2 days prior.  This visit occurred during the SARS-CoV-2 public health emergency.  Safety protocols were in place, including screening questions prior to the visit, additional usage of staff PPE, and extensive cleaning of exam room while observing appropriate contact time as indicated for disinfecting solutions.    Tommi Rumps, MD [place]

## 2021-11-05 NOTE — Assessment & Plan Note (Signed)
Reports good control.  Discussed that supplements are not FDA regulated and its difficult to tell how they would affect him long-term.  Advised if he is going to take a supplement he needs to follow the instructions on the bottle.

## 2021-11-05 NOTE — Patient Instructions (Signed)
Nice to see you. We will get lab work today. Please continue with healthy diet and exercise. 

## 2022-03-18 DIAGNOSIS — D485 Neoplasm of uncertain behavior of skin: Secondary | ICD-10-CM | POA: Diagnosis not present

## 2022-03-18 DIAGNOSIS — Z85828 Personal history of other malignant neoplasm of skin: Secondary | ICD-10-CM | POA: Diagnosis not present

## 2022-03-18 DIAGNOSIS — X32XXXA Exposure to sunlight, initial encounter: Secondary | ICD-10-CM | POA: Diagnosis not present

## 2022-03-18 DIAGNOSIS — D044 Carcinoma in situ of skin of scalp and neck: Secondary | ICD-10-CM | POA: Diagnosis not present

## 2022-03-18 DIAGNOSIS — Z08 Encounter for follow-up examination after completed treatment for malignant neoplasm: Secondary | ICD-10-CM | POA: Diagnosis not present

## 2022-03-18 DIAGNOSIS — L570 Actinic keratosis: Secondary | ICD-10-CM | POA: Diagnosis not present

## 2022-03-18 DIAGNOSIS — D045 Carcinoma in situ of skin of trunk: Secondary | ICD-10-CM | POA: Diagnosis not present

## 2022-03-18 DIAGNOSIS — C4442 Squamous cell carcinoma of skin of scalp and neck: Secondary | ICD-10-CM | POA: Diagnosis not present

## 2022-03-18 DIAGNOSIS — L578 Other skin changes due to chronic exposure to nonionizing radiation: Secondary | ICD-10-CM | POA: Diagnosis not present

## 2022-03-24 DIAGNOSIS — D045 Carcinoma in situ of skin of trunk: Secondary | ICD-10-CM | POA: Diagnosis not present

## 2022-04-01 ENCOUNTER — Telehealth: Payer: PPO

## 2022-04-01 ENCOUNTER — Telehealth: Payer: Self-pay

## 2022-04-01 ENCOUNTER — Ambulatory Visit: Payer: PPO

## 2022-04-01 ENCOUNTER — Ambulatory Visit (INDEPENDENT_AMBULATORY_CARE_PROVIDER_SITE_OTHER): Payer: PPO

## 2022-04-01 VITALS — Ht 67.0 in | Wt 170.0 lb

## 2022-04-01 DIAGNOSIS — Z0000 Encounter for general adult medical examination without abnormal findings: Secondary | ICD-10-CM

## 2022-04-01 NOTE — Progress Notes (Signed)
Subjective:   Henry Skinner is a 76 y.o. male who presents for Medicare Annual/Subsequent preventive examination.  Review of Systems    No ROS.  Medicare Wellness Virtual Visit.  Visual/audio telehealth visit, UTA vital signs.   See social history for additional risk factors.   Cardiac Risk Factors include: advanced age (>73men, >63 women);male gender     Objective:    Today's Vitals   04/01/22 1506  Weight: 170 lb (77.1 kg)  Height: 5\' 7"  (1.702 m)   Body mass index is 26.63 kg/m.     04/01/2022    3:10 PM 03/26/2021    1:30 PM 03/23/2020    1:39 PM 02/01/2020    1:48 PM 08/30/2019    8:32 AM 03/23/2019    3:06 PM 03/19/2018    2:48 PM  Advanced Directives  Does Patient Have a Medical Advance Directive? Yes No No No Yes No Yes  Type of Estate agent of [place];Living will    Living will  Living will;Healthcare Power of Attorney  Does patient want to make changes to medical advance directive? No - Patient declined      No - Patient declined  Copy of Healthcare Power of Attorney in Chart? No - copy requested      No - copy requested  Would patient like information on creating a medical advance directive?  No - Patient declined Yes (MAU/Ambulatory/Procedural Areas - Information given)   No - Patient declined     Current Medications (verified) Outpatient Encounter Medications as of 04/01/2022  Medication Sig   b complex vitamins tablet Take 1 tablet by mouth daily.   Elastic Bandages & Supports (MEDICAL COMPRESSION THIGH HIGH) MISC Apply in the morning and remove at bedtime.   losartan (COZAAR) 50 MG tablet TAKE 1 TABLET BY MOUTH EVERY DAY   Omega-3 Fatty Acids (FISH OIL) 500 MG CAPS Take by mouth.   Specialty Vitamins Products (PROSTATE PO) Take by mouth. Force Factor Brand   TURMERIC PO Take 1 tablet by mouth at bedtime.   VITAMIN D, CHOLECALCIFEROL, PO Take 2,000 Units by mouth.   vitamin E 1000 UNIT capsule Take 1,000 Units by mouth daily.   No  facility-administered encounter medications on file as of 04/01/2022.    Allergies (verified) Patient has no known allergies.   History: Past Medical History:  Diagnosis Date   Chickenpox    Hypertension    Kidney stones    Skin irritation    Past Surgical History:  Procedure Laterality Date   COLONOSCOPY WITH PROPOFOL N/A 08/30/2019   Procedure: COLONOSCOPY WITH PROPOFOL;  Surgeon: Wyline Mood, MD;  Location: [facility] ENDOSCOPY;  Service: Gastroenterology;  Laterality: N/A;   PAROTIDECTOMY Left 03/04/2016   Procedure: PAROTIDECTOMY;  Surgeon: Linus Salmons, MD;  Location: [facility] ORS;  Service: ENT;  Laterality: Left;   ROTATOR CUFF REPAIR  May 2016   SKIN SURGERY     L hand, L arm/shoulder    Family History  Problem Relation Age of Onset   Alcoholism Brother    Colon cancer Maternal Uncle    Social History   Socioeconomic History   Marital status: Single    Spouse name: Not on file   Number of children: Not on file   Years of education: Not on file   Highest education level: Not on file  Occupational History   Not on file  Tobacco Use   Smoking status: Never   Smokeless tobacco: Never  Vaping Use   Vaping  Use: Never used  Substance and Sexual Activity   Alcohol use: Yes    Alcohol/week: 1.0 standard drink    Types: 1 Standard drinks or equivalent per week    Comment: 1 beer daily   Drug use: Yes    Types: Marijuana    Comment: marijuana, rarely. Last use 11/2018   Sexual activity: Yes  Other Topics Concern   Not on file  Social History Narrative   Not on file   Social Determinants of Health   Financial Resource Strain: Low Risk    Difficulty of Paying Living Expenses: Not hard at all  Food Insecurity: No Food Insecurity   Worried About Programme researcher, broadcasting/film/video in the Last Year: Never true   Ran Out of Food in the Last Year: Never true  Transportation Needs: No Transportation Needs   Lack of Transportation (Medical): No   Lack of Transportation (Non-Medical): No   Physical Activity: Sufficiently Active   Days of Exercise per Week: 5 days   Minutes of Exercise per Session: 30 min  Stress: No Stress Concern Present   Feeling of Stress : Not at all  Social Connections: Unknown   Frequency of Communication with Friends and Family: More than three times a week   Frequency of Social Gatherings with Friends and Family: More than three times a week   Attends Religious Services: Not on Scientist, clinical (histocompatibility and immunogenetics) or Organizations: Not on file   Attends Banker Meetings: Not on file   Marital Status: Not on file    Tobacco Counseling Counseling given: Not Answered   Clinical Intake:  Pre-visit preparation completed: Yes        Diabetes: No  How often do you need to have someone help you when you read instructions, pamphlets, or other written materials from your doctor or pharmacy?: 1 - Never  Interpreter Needed?: No     Activities of Daily Living    04/01/2022    3:11 PM  In your present state of health, do you have any difficulty performing the following activities:  Hearing? 0  Vision? 0  Difficulty concentrating or making decisions? 0  Walking or climbing stairs? 0  Dressing or bathing? 0  Doing errands, shopping? 0  Preparing Food and eating ? N  Using the Toilet? N  In the past six months, have you accidently leaked urine? N  Do you have problems with loss of bowel control? N  Managing your Medications? N  Managing your Finances? N  Housekeeping or managing your Housekeeping? N   Patient Care Team: Glori Luis, MD as PCP - General (Family Medicine)  Indicate any recent Medical Services you may have received from other than [facility] providers in the past year (date may be approximate).     Assessment:   This is a routine wellness examination for Henry Skinner.  Virtual Visit via Telephone Note  I connected with  Henry Skinner on 04/01/22 at  3:30 PM EDT by telephone and verified that I am speaking with the  correct person using two identifiers.  Persons participating in the virtual visit: patient/Nurse Health Advisor   I discussed the limitations of performing an evaluation and management service by telehealth. We continued and completed visit with audio only. Some vital signs may be absent or patient reported.   Hearing/Vision screen Hearing Screening - Comments:: Patient is able to hear conversational tones without difficulty.  No issues reported. Vision Screening - Comments:: Notes 20/50 without correction  Does not wear glasses  Dietary issues and exercise activities discussed: Current Exercise Habits: Home exercise routine, Type of exercise: walking, Time (Minutes): 30, Frequency (Times/Week): 5, Weekly Exercise (Minutes/Week): 150, Intensity: Mild Regular diet   Goals Addressed               This Visit's Progress     Patient Stated     Increase physical activity (pt-stated)   On track     Walk more for exercise        Depression Screen    04/01/2022    3:08 PM 11/05/2021   10:37 AM 03/26/2021    1:28 PM 03/12/2021    4:11 PM 10/08/2020    3:38 PM 03/23/2020    1:38 PM 02/24/2020    2:17 PM  PHQ 2/9 Scores  PHQ - 2 Score 0 0 0 0 0 0 0    Fall Risk    04/01/2022    3:11 PM 11/05/2021   10:35 AM 03/26/2021    1:31 PM 03/12/2021    4:11 PM 11/15/2020    1:10 PM  Fall Risk   Falls in the past year? 0 0 1 1 0  Number falls in past yr: 0 0  0 0  Injury with Fall?  0  1 0  Risk for fall due to :  No Fall Risks     Follow up Falls evaluation completed Falls evaluation completed Falls evaluation completed Falls evaluation completed Falls evaluation completed    FALL RISK PREVENTION PERTAINING TO THE HOME: Home free of loose throw rugs in walkways, pet beds, electrical cords, etc? Yes  Adequate lighting in your home to reduce risk of falls? Yes   ASSISTIVE DEVICES UTILIZED TO PREVENT FALLS: Life alert? No  Use of a cane, walker or w/c? No   TIMED UP AND GO: Was the  test performed? No .   Cognitive Function:    03/19/2018    2:45 PM 03/18/2017    2:47 PM  MMSE - Mini Mental State Exam  Orientation to time 5 5  Orientation to Place 5 5  Registration 3 3  Attention/ Calculation 5 5  Recall 3 3  Language- name 2 objects 2 2  Language- repeat 1 1  Language- follow 3 step command 3 3  Language- read & follow direction 1 1  Write a sentence 1 1  Copy design 1 1  Total score 30 30        04/01/2022    3:14 PM 03/26/2021    1:35 PM 03/23/2020    1:56 PM 03/23/2019    3:08 PM  6CIT Screen  What Year? 0 points 0 points 0 points 0 points  What month? 0 points 0 points 0 points 0 points  What time? 0 points 0 points 0 points 0 points  Count back from 20 0 points 0 points 0 points 0 points  Months in reverse 0 points 0 points 0 points 0 points  Repeat phrase 0 points 0 points 0 points 0 points  Total Score 0 points 0 points 0 points 0 points    Immunizations Immunization History  Administered Date(s) Administered   Influenza, High Dose Seasonal PF 11/09/2015, 09/10/2016, 08/19/2017, 08/09/2018, 07/05/2019, 07/05/2019, 07/31/2020, 06/26/2021   Influenza,inj,quad, With Preservative 08/22/2014   Influenza-Unspecified 10/20/2016   PFIZER Comirnaty(Gray Top)Covid-19 Tri-Sucrose Vaccine 03/15/2021   PFIZER(Purple Top)SARS-COV-2 Vaccination 12/27/2019, 01/17/2020, 08/14/2020   PNEUMOCOCCAL CONJUGATE-20 06/26/2021   Pfizer Covid-19 Vaccine Bivalent Booster 63yrs & up 09/06/2021  Pneumococcal Conjugate-13 12/25/2016   Pneumococcal Polysaccharide-23 03/19/2018   Td 03/12/2021   Tdap 01/08/2016   Zoster Recombinat (Shingrix) 06/08/2019, 09/23/2019   Zoster, Live 12/18/2006   Screening Tests Health Maintenance  Topic Date Due   INFLUENZA VACCINE  06/17/2022   COLONOSCOPY (Pts 45-26yrs Insurance coverage will need to be confirmed)  08/29/2029   TETANUS/TDAP  03/13/2031   Pneumonia Vaccine 52+ Years old  Completed   COVID-19 Vaccine  Completed    Hepatitis C Screening  Completed   Zoster Vaccines- Shingrix  Completed   HPV VACCINES  Aged Out   Health Maintenance There are no preventive care reminders to display for this patient.  Lung Cancer Screening: (Low Dose CT Chest recommended if Age 60-80 years, 30 pack-year currently smoking OR have quit w/in 15years.) does not qualify.   Vision Screening: Recommended annual ophthalmology exams for early detection of glaucoma and other disorders of the eye.  Dental Screening: Recommended annual dental exams for proper oral hygiene  Community Resource Referral / Chronic Care Management: CRR required this visit?  No   CCM required this visit?  No      Plan:   Keep all routine maintenance appointments.   I have personally reviewed and noted the following in the patient's chart:   Medical and social history Use of alcohol, tobacco or illicit drugs  Current medications and supplements including opioid prescriptions. Patient is not currently taking opioid prescriptions. Functional ability and status Nutritional status Physical activity Advanced directives List of other physicians Hospitalizations, surgeries, and ER visits in previous 12 months Vitals Screenings to include cognitive, depression, and falls Referrals and appointments  In addition, I have reviewed and discussed with patient certain preventive protocols, quality metrics, and best practice recommendations. A written personalized care plan for preventive services as well as general preventive health recommendations were provided to patient.     Ashok Pall, LPN   1/61/0960

## 2022-04-01 NOTE — Telephone Encounter (Signed)
No answer when called for scheduled AWV on preferred number. Unable to leave a message. Okay to reschedule.  ?

## 2022-04-01 NOTE — Patient Instructions (Addendum)
?  Henry Skinner , ?Thank you for taking time to come for your Medicare Wellness Visit. I appreciate your ongoing commitment to your health goals. Please review the following plan we discussed and let me know if I can assist you in the future.  ? ?These are the goals we discussed: ? Goals   ? ?  ? Patient Stated  ?   DIET - REDUCE SUGAR INTAKE (pt-stated)   ?   Continue reducing sugar intake; monitor diet ? ?  ?   Increase physical activity (pt-stated)   ?   Walk more for exercise ? ?  ? ?  ?  ?This is a list of the screening recommended for you and due dates:  ?Health Maintenance  ?Topic Date Due  ? Flu Shot  06/17/2022  ? Colon Cancer Screening  08/29/2029  ? Tetanus Vaccine  03/13/2031  ? Pneumonia Vaccine  Completed  ? COVID-19 Vaccine  Completed  ? Hepatitis C Screening: USPSTF Recommendation to screen - Ages 103-79 yo.  Completed  ? Zoster (Shingles) Vaccine  Completed  ? HPV Vaccine  Aged Out  ?  ?

## 2022-04-28 DIAGNOSIS — D044 Carcinoma in situ of skin of scalp and neck: Secondary | ICD-10-CM | POA: Diagnosis not present

## 2022-04-28 DIAGNOSIS — L570 Actinic keratosis: Secondary | ICD-10-CM | POA: Diagnosis not present

## 2022-04-28 DIAGNOSIS — C44321 Squamous cell carcinoma of skin of nose: Secondary | ICD-10-CM | POA: Diagnosis not present

## 2022-05-01 ENCOUNTER — Other Ambulatory Visit (INDEPENDENT_AMBULATORY_CARE_PROVIDER_SITE_OTHER): Payer: PPO

## 2022-05-01 ENCOUNTER — Other Ambulatory Visit: Payer: PPO

## 2022-05-01 DIAGNOSIS — R7303 Prediabetes: Secondary | ICD-10-CM

## 2022-05-01 DIAGNOSIS — I10 Essential (primary) hypertension: Secondary | ICD-10-CM

## 2022-05-01 LAB — HEMOGLOBIN A1C: Hgb A1c MFr Bld: 5.6 % (ref 4.6–6.5)

## 2022-05-01 LAB — LIPID PANEL
Cholesterol: 148 mg/dL (ref 0–200)
HDL: 54.1 mg/dL (ref >39.00)
LDL Cholesterol: 83 mg/dL (ref 0–99)
NonHDL: 94.16
Total CHOL/HDL Ratio: 3
Triglycerides: 54 mg/dL (ref 0.0–149.0)
VLDL: 10.8 mg/dL (ref 0.0–40.0)

## 2022-05-01 LAB — COMPREHENSIVE METABOLIC PANEL
ALT: 24 U/L (ref 0–53)
AST: 20 U/L (ref 0–37)
Albumin: 4.3 g/dL (ref 3.5–5.2)
Alkaline Phosphatase: 58 U/L (ref 39–117)
BUN: 16 mg/dL (ref 6–23)
CO2: 29 mEq/L (ref 19–32)
Calcium: 9.3 mg/dL (ref 8.4–10.5)
Chloride: 104 mEq/L (ref 96–112)
Creatinine, Ser: 0.9 mg/dL (ref 0.40–1.50)
GFR: 84.43 mL/min (ref >60.00)
Glucose, Bld: 84 mg/dL (ref 70–99)
Potassium: 3.9 mEq/L (ref 3.5–5.1)
Sodium: 140 mEq/L (ref 135–145)
Total Bilirubin: 1.3 mg/dL — ABNORMAL HIGH (ref 0.2–1.2)
Total Protein: 7 g/dL (ref 6.0–8.3)

## 2022-05-06 ENCOUNTER — Encounter: Payer: Self-pay | Admitting: Family Medicine

## 2022-05-06 ENCOUNTER — Ambulatory Visit (INDEPENDENT_AMBULATORY_CARE_PROVIDER_SITE_OTHER): Payer: PPO | Admitting: Family Medicine

## 2022-05-06 DIAGNOSIS — I10 Essential (primary) hypertension: Secondary | ICD-10-CM | POA: Diagnosis not present

## 2022-05-06 DIAGNOSIS — C4490 Unspecified malignant neoplasm of skin, unspecified: Secondary | ICD-10-CM

## 2022-05-06 DIAGNOSIS — R7303 Prediabetes: Secondary | ICD-10-CM

## 2022-05-06 DIAGNOSIS — R0781 Pleurodynia: Secondary | ICD-10-CM | POA: Diagnosis not present

## 2022-05-06 DIAGNOSIS — R0789 Other chest pain: Secondary | ICD-10-CM | POA: Insufficient documentation

## 2022-05-06 DIAGNOSIS — M25519 Pain in unspecified shoulder: Secondary | ICD-10-CM | POA: Insufficient documentation

## 2022-05-06 NOTE — Assessment & Plan Note (Signed)
Adequate control for age.  He will continue losartan 50 mg daily.  Labs reviewed.  Bilirubin minimally elevated.  Plan to recheck bilirubin at next visit.

## 2022-05-06 NOTE — Patient Instructions (Signed)
Nice to see you. If your rib pain returns please let us know. Please keep an eye on your scalp wound and if you notice any signs of infection please seek medical attention.

## 2022-05-06 NOTE — Assessment & Plan Note (Signed)
Possibly with muscle strain versus lung irritation from breathing and smoke.  This issue has resolved.  He will monitor for recurrence and let us know if it does recur.

## 2022-05-06 NOTE — Assessment & Plan Note (Signed)
Improved to the normal range.  We will continue to monitor.

## 2022-05-06 NOTE — Assessment & Plan Note (Signed)
Squamous cell carcinoma status post Mohs surgery.  He will continue to follow with dermatology.
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
## 2022-06-04 ENCOUNTER — Encounter: Payer: Self-pay | Admitting: Internal Medicine

## 2022-06-04 ENCOUNTER — Ambulatory Visit (INDEPENDENT_AMBULATORY_CARE_PROVIDER_SITE_OTHER): Payer: PPO | Admitting: Internal Medicine

## 2022-06-04 VITALS — BP 138/70 | HR 78 | Temp 97.7°F | Ht 67.0 in | Wt 170.8 lb

## 2022-06-04 DIAGNOSIS — J309 Allergic rhinitis, unspecified: Secondary | ICD-10-CM | POA: Diagnosis not present

## 2022-06-04 DIAGNOSIS — H9311 Tinnitus, right ear: Secondary | ICD-10-CM

## 2022-06-04 DIAGNOSIS — H6981 Other specified disorders of Eustachian tube, right ear: Secondary | ICD-10-CM

## 2022-06-04 DIAGNOSIS — H6991 Unspecified Eustachian tube disorder, right ear: Secondary | ICD-10-CM

## 2022-06-04 MED ORDER — FLUTICASONE PROPIONATE 50 MCG/ACT NA SUSP: 2.0000 | Freq: Every day | NASAL | 2 refills | Status: DC

## 2022-06-04 MED ORDER — SALINE SPRAY 0.65 % NA SOLN: 2.0000 | NASAL | 2 refills | Status: DC | PRN

## 2022-06-04 MED ORDER — CETIRIZINE HCL 10 MG PO TABS: 10.0000 mg | ORAL_TABLET | Freq: Every evening | ORAL | 11 refills | Status: DC | PRN

## 2022-06-04 NOTE — Progress Notes (Signed)
Chief Complaint  Patient presents with   Tinnitus    Not a ringing but a clicking noise in right ear   F/u  1. Right ear clicking x 10 x in 1 day since 7-10 days nothing worse or better nothing tried    Review of Systems  Constitutional:  Negative for weight loss.  HENT:  Negative for hearing loss.   Eyes:  Negative for blurred vision.  Respiratory:  Negative for shortness of breath.   Cardiovascular:  Negative for chest pain.  Gastrointestinal:  Negative for abdominal pain and blood in stool.  Genitourinary:  Negative for dysuria.  Musculoskeletal:  Negative for falls and joint pain.  Skin:  Negative for rash.  Neurological:  Negative for headaches.  Psychiatric/Behavioral:  Negative for depression.    Past Medical History:  Diagnosis Date   Chickenpox    Hypertension    Kidney stones    Skin irritation    Past Surgical History:  Procedure Laterality Date   COLONOSCOPY WITH PROPOFOL N/A 08/30/2019   Procedure: COLONOSCOPY WITH PROPOFOL;  Surgeon: Jonathon Bellows, MD;  Location: [facility] ENDOSCOPY;  Service: Gastroenterology;  Laterality: N/A;   PAROTIDECTOMY Left 03/04/2016   Procedure: PAROTIDECTOMY;  Surgeon: Beverly Gust, MD;  Location: [facility] ORS;  Service: ENT;  Laterality: Left;   ROTATOR CUFF REPAIR  May 2016   SKIN SURGERY     L hand, L arm/shoulder    Family History  Problem Relation Age of Onset   Alcoholism Brother    Colon cancer Maternal Uncle    Social History   Socioeconomic History   Marital status: Single    Spouse name: Not on file   Number of children: Not on file   Years of education: Not on file   Highest education level: Not on file  Occupational History   Not on file  Tobacco Use   Smoking status: Never   Smokeless tobacco: Never  Vaping Use   Vaping Use: Never used  Substance and Sexual Activity   Alcohol use: Yes    Alcohol/week: 1.0 standard drink of alcohol    Types: 1 Standard drinks or equivalent per week    Comment: 1 beer daily    Drug use: Yes    Types: Marijuana    Comment: marijuana, rarely. Last use 11/2018   Sexual activity: Yes  Other Topics Concern   Not on file  Social History Narrative   Not on file   Social Determinants of Health   Financial Resource Strain: Low Risk  (04/01/2022)   Overall Financial Resource Strain (CARDIA)    Difficulty of Paying Living Expenses: Not hard at all  Food Insecurity: No Food Insecurity (04/01/2022)   Hunger Vital Sign    Worried About Running Out of Food in the Last Year: Never true    Ran Out of Food in the Last Year: Never true  Transportation Needs: No Transportation Needs (04/01/2022)   PRAPARE - Hydrologist (Medical): No    Lack of Transportation (Non-Medical): No  Physical Activity: Sufficiently Active (04/01/2022)   Exercise Vital Sign    Days of Exercise per Week: 5 days    Minutes of Exercise per Session: 30 min  Stress: No Stress Concern Present (04/01/2022)   [place]    Feeling of Stress : Not at all  Social Connections: Unknown (04/01/2022)   Social Connection and Isolation Panel [NHANES]    Frequency of Communication with  Friends and Family: More than three times a week    Frequency of Social Gatherings with Friends and Family: More than three times a week    Attends Religious Services: Not on file    Active Member of Clubs or Organizations: Not on file    Attends Archivist Meetings: Not on file    Marital Status: Not on file  Intimate Partner Violence: Not At Risk (04/01/2022)   Humiliation, Afraid, Rape, and Kick questionnaire    Fear of Current or Ex-Partner: No    Emotionally Abused: No    Physically Abused: No    Sexually Abused: No   Current Meds  Medication Sig   b complex vitamins tablet Take 1 tablet by mouth daily.   cetirizine (ZYRTEC) 10 MG tablet Take 1 tablet (10 mg total) by mouth at bedtime as needed for allergies.   Elastic  Bandages & Supports (MEDICAL COMPRESSION THIGH HIGH) MISC Apply in the morning and remove at bedtime.   fluticasone (FLONASE) 50 MCG/ACT nasal spray Place 2 sprays into both nostrils daily. prn   losartan (COZAAR) 50 MG tablet TAKE 1 TABLET BY MOUTH EVERY DAY   Omega-3 Fatty Acids (FISH OIL) 500 MG CAPS Take by mouth.   sodium chloride (OCEAN) 0.65 % SOLN nasal spray Place 2 sprays into both nostrils as needed for congestion.   Specialty Vitamins Products (PROSTATE PO) Take by mouth. Force Factor Brand   TURMERIC PO Take 1 tablet by mouth at bedtime.   VITAMIN D, CHOLECALCIFEROL, PO Take 2,000 Units by mouth.   vitamin E 1000 UNIT capsule Take 1,000 Units by mouth daily.   No Known Allergies Recent Results (from the past 2160 hour(s))  Hemoglobin A1c     Status: None   Collection Time: 05/01/22  2:14 PM  Result Value Ref Range   Hgb A1c MFr Bld 5.6 4.6 - 6.5 %    Comment: Glycemic Control Guidelines for People with Diabetes:Non Diabetic:  <6%Goal of Therapy: <7%Additional Action Suggested:  >8%   Lipid panel     Status: None   Collection Time: 05/01/22  2:14 PM  Result Value Ref Range   Cholesterol 148 0 - 200 mg/dL    Comment: ATP III Classification       Desirable:  < 200 mg/dL               Borderline High:  200 - 239 mg/dL          High:  > = 240 mg/dL   Triglycerides 54.0 0.0 - 149.0 mg/dL    Comment: Normal:  <150 mg/dLBorderline High:  150 - 199 mg/dL   HDL 54.10 >39.00 mg/dL   VLDL 10.8 0.0 - 40.0 mg/dL   LDL Cholesterol 83 0 - 99 mg/dL   Total CHOL/HDL Ratio 3     Comment:                Men          Women1/2 Average Risk     3.4          3.3Average Risk          5.0          4.42X Average Risk          9.6          7.13X Average Risk          15.0          11.0  NonHDL 94.16     Comment: NOTE:  Non-HDL goal should be 30 mg/dL higher than patient's LDL goal (i.e. LDL goal of < 70 mg/dL, would have non-HDL goal of < 100 mg/dL)  Comp Met (CMET)     Status:  Abnormal   Collection Time: 05/01/22  2:14 PM  Result Value Ref Range   Sodium 140 135 - 145 mEq/L   Potassium 3.9 3.5 - 5.1 mEq/L   Chloride 104 96 - 112 mEq/L   CO2 29 19 - 32 mEq/L   Glucose, Bld 84 70 - 99 mg/dL   BUN 16 6 - 23 mg/dL   Creatinine, Ser 0.90 0.40 - 1.50 mg/dL   Total Bilirubin 1.3 (H) 0.2 - 1.2 mg/dL   Alkaline Phosphatase 58 39 - 117 U/L   AST 20 0 - 37 U/L   ALT 24 0 - 53 U/L   Total Protein 7.0 6.0 - 8.3 g/dL   Albumin 4.3 3.5 - 5.2 g/dL   GFR 84.43 >60.00 mL/min    Comment: Calculated using the CKD-EPI Creatinine Equation (2021)   Calcium 9.3 8.4 - 10.5 mg/dL   Objective  Body mass index is 26.75 kg/m. Wt Readings from Last 3 Encounters:  06/04/22 170 lb 12.8 oz (77.5 kg)  05/06/22 171 lb 6.4 oz (77.7 kg)  04/01/22 170 lb (77.1 kg)   Temp Readings from Last 3 Encounters:  06/04/22 97.7 F (36.5 C) (Oral)  05/06/22 98.5 F (36.9 C) (Oral)  11/05/21 98.6 F (37 C) (Oral)   BP Readings from Last 3 Encounters:  06/04/22 138/70  05/06/22 138/78  11/05/21 120/60   Pulse Readings from Last 3 Encounters:  06/04/22 78  05/06/22 89  11/05/21 67    Physical Exam Vitals and nursing note reviewed.  Constitutional:      Appearance: Normal appearance. He is well-developed and well-groomed.  HENT:     Head: Normocephalic and atraumatic.     Comments: ETD b/l ears  Eyes:     Conjunctiva/sclera: Conjunctivae normal.     Pupils: Pupils are equal, round, and reactive to light.  Cardiovascular:     Rate and Rhythm: Normal rate and regular rhythm.     Heart sounds: Normal heart sounds.  Pulmonary:     Effort: Pulmonary effort is normal. No respiratory distress.     Breath sounds: Normal breath sounds.  Abdominal:     Tenderness: There is no abdominal tenderness.  Skin:    General: Skin is warm and moist.  Neurological:     General: No focal deficit present.     Mental Status: He is alert and oriented to person, place, and time. Mental status is at  baseline.     Sensory: Sensation is intact.     Motor: Motor function is intact.     Coordination: Coordination is intact.     Gait: Gait is intact. Gait normal.  Psychiatric:        Attention and Perception: Attention and perception normal.        Mood and Affect: Mood and affect normal.        Speech: Speech normal.        Behavior: Behavior normal. Behavior is cooperative.        Thought Content: Thought content normal.        Cognition and Memory: Cognition and memory normal.        Judgment: Judgment normal.     Assessment  Plan  Dysfunction of right eustachian tube -  Plan: fluticasone (FLONASE) 50 MCG/ACT nasal spray, sodium chloride (OCEAN) 0.65 % SOLN nasal spray, cetirizine (ZYRTEC) 10 MG tablet  Ear noise/buzzing, right - Plan: fluticasone (FLONASE) 50 MCG/ACT nasal spray, sodium chloride (OCEAN) 0.65 % SOLN nasal spray, cetirizine (ZYRTEC) 10 MG tablet  Allergic rhinitis, unspecified seasonality, unspecified trigger - Plan: fluticasone (FLONASE) 50 MCG/ACT nasal spray, sodium chloride (OCEAN) 0.65 % SOLN nasal spray, cetirizine (ZYRTEC) 10 MG tablet   Consider ent if not better in 1 week  Provider: Dr. Olivia Mackie McLean-Scocuzza-Internal Medicine

## 2022-06-04 NOTE — Patient Instructions (Addendum)
Call me back in 1 week if ok or not   If not better will do urgent referral ENT   Eustachian Tube Dysfunction  Eustachian tube dysfunction refers to a condition in which a blockage develops in the narrow passage that connects the middle ear to the back of the nose (eustachian tube). The eustachian tube regulates air pressure in the middle ear by letting air move between the ear and nose. It also helps to drain fluid from the middle ear space. Eustachian tube dysfunction can affect one or both ears. When the eustachian tube does not function properly, air pressure, fluid, or both can build up in the middle ear. What are the causes? This condition occurs when the eustachian tube becomes blocked or cannot open normally. Common causes of this condition include: Ear infections. Colds and other infections that affect the nose, mouth, and throat (upper respiratory tract). Allergies. Irritation from cigarette smoke. Irritation from stomach acid coming up into the esophagus (gastroesophageal reflux). The esophagus is the part of the body that moves food from the mouth to the stomach. Sudden changes in air pressure, such as from descending in an airplane or scuba diving. Abnormal growths in the nose or throat, such as: Growths that line the nose (nasal polyps). Abnormal growth of cells (tumors). Enlarged tissue at the back of the throat (adenoids). What increases the risk? You are more likely to develop this condition if: You smoke. You are overweight. You are a child who has: Certain birth defects of the mouth, such as cleft palate. Large tonsils or adenoids. What are the signs or symptoms? Common symptoms of this condition include: A feeling of fullness in the ear. Ear pain. Clicking or popping noises in the ear. Ringing in the ear (tinnitus). Hearing loss. Loss of balance. Dizziness. Symptoms may get worse when the air pressure around you changes, such as when you travel to an area of  high elevation, fly on an airplane, or go scuba diving. How is this diagnosed? This condition may be diagnosed based on: Your symptoms. A physical exam of your ears, nose, and throat. Tests, such as those that measure: The movement of your eardrum. Your hearing (audiometry). How is this treated? Treatment depends on the cause and severity of your condition. In mild cases, you may relieve your symptoms by moving air into your ears. This is called "popping the ears." In more severe cases, or if you have symptoms of fluid in your ears, treatment may include: Medicines to relieve congestion (decongestants). Medicines that treat allergies (antihistamines). Nasal sprays or ear drops that contain medicines that reduce swelling (steroids). A procedure to drain the fluid in your eardrum. In this procedure, a small tube may be placed in the eardrum to: Drain the fluid. Restore the air in the middle ear space. A procedure to insert a balloon device through the nose to inflate the opening of the eustachian tube (balloon dilation). Follow these instructions at home: Lifestyle Do not do any of the following until your health care provider approves: Travel to high altitudes. Fly in airplanes. Work in a Pension scheme manager or room. Scuba dive. Do not use any products that contain nicotine or tobacco. These products include cigarettes, chewing tobacco, and vaping devices, such as e-cigarettes. If you need help quitting, ask your health care provider. Keep your ears dry. Wear fitted earplugs during showering and bathing. Dry your ears completely after. General instructions Take over-the-counter and prescription medicines only as told by your health care provider. Use  techniques to help pop your ears as recommended by your health care provider. These may include: Chewing gum. Yawning. Frequent, forceful swallowing. Closing your mouth, holding your nose closed, and gently blowing as if you are trying to  blow air out of your nose. Keep all follow-up visits. This is important. Contact a health care provider if: Your symptoms do not go away after treatment. Your symptoms come back after treatment. You are unable to pop your ears. You have: A fever. Pain in your ear. Pain in your head or neck. Fluid draining from your ear. Your hearing suddenly changes. You become very dizzy. You lose your balance. Get help right away if: You have a sudden, severe increase in any of your symptoms. Summary Eustachian tube dysfunction refers to a condition in which a blockage develops in the eustachian tube. It can be caused by ear infections, allergies, inhaled irritants, or abnormal growths in the nose or throat. Symptoms may include ear pain or fullness, hearing loss, or ringing in the ears. Mild cases are treated with techniques to unblock the ears, such as yawning or chewing gum. More severe cases are treated with medicines or procedures. This information is not intended to replace advice given to you by your health care provider. Make sure you discuss any questions you have with your health care provider. Document Revised: 01/14/2021 Document Reviewed: 01/14/2021 Elsevier Patient Education  [place].

## 2022-06-17 ENCOUNTER — Telehealth: Payer: Self-pay | Admitting: Family Medicine

## 2022-06-17 NOTE — Telephone Encounter (Signed)
Patient stated that he was returning Dr Audrie Gallus call about his ear tube dysfunction.

## 2022-06-18 NOTE — Telephone Encounter (Signed)
Patient returned office phone call. Patient stated to leave a detailed message, he is driving.

## 2022-08-05 DIAGNOSIS — Z85828 Personal history of other malignant neoplasm of skin: Secondary | ICD-10-CM | POA: Diagnosis not present

## 2022-08-05 DIAGNOSIS — D2262 Melanocytic nevi of left upper limb, including shoulder: Secondary | ICD-10-CM | POA: Diagnosis not present

## 2022-08-05 DIAGNOSIS — X32XXXA Exposure to sunlight, initial encounter: Secondary | ICD-10-CM | POA: Diagnosis not present

## 2022-08-05 DIAGNOSIS — R208 Other disturbances of skin sensation: Secondary | ICD-10-CM | POA: Diagnosis not present

## 2022-08-05 DIAGNOSIS — D2272 Melanocytic nevi of left lower limb, including hip: Secondary | ICD-10-CM | POA: Diagnosis not present

## 2022-08-05 DIAGNOSIS — L820 Inflamed seborrheic keratosis: Secondary | ICD-10-CM | POA: Diagnosis not present

## 2022-08-05 DIAGNOSIS — D2261 Melanocytic nevi of right upper limb, including shoulder: Secondary | ICD-10-CM | POA: Diagnosis not present

## 2022-08-05 DIAGNOSIS — L538 Other specified erythematous conditions: Secondary | ICD-10-CM | POA: Diagnosis not present

## 2022-08-05 DIAGNOSIS — L570 Actinic keratosis: Secondary | ICD-10-CM | POA: Diagnosis not present

## 2022-09-19 ENCOUNTER — Other Ambulatory Visit: Payer: Self-pay | Admitting: Family Medicine

## 2022-09-19 DIAGNOSIS — I10 Essential (primary) hypertension: Secondary | ICD-10-CM

## 2022-11-05 ENCOUNTER — Ambulatory Visit (INDEPENDENT_AMBULATORY_CARE_PROVIDER_SITE_OTHER): Payer: PPO | Admitting: Family Medicine

## 2022-11-05 ENCOUNTER — Encounter: Payer: Self-pay | Admitting: Family Medicine

## 2022-11-05 VITALS — BP 130/60 | HR 69 | Temp 98.4°F | Ht 67.0 in | Wt 169.1 lb

## 2022-11-05 DIAGNOSIS — Z0001 Encounter for general adult medical examination with abnormal findings: Secondary | ICD-10-CM | POA: Insufficient documentation

## 2022-11-05 DIAGNOSIS — R252 Cramp and spasm: Secondary | ICD-10-CM | POA: Diagnosis not present

## 2022-11-05 DIAGNOSIS — R7303 Prediabetes: Secondary | ICD-10-CM

## 2022-11-05 DIAGNOSIS — I10 Essential (primary) hypertension: Secondary | ICD-10-CM

## 2022-11-05 NOTE — Assessment & Plan Note (Signed)
Physical exam completed.  Encouraged continued healthy diet and exercise.  Colonoscopy is up-to-date.  I encouraged the patient to get the RSV vaccine at the pharmacy.  Advised he should not smoke marijuana due to the effects of could have on his lungs.  If he is going to continue to periodically smoke marijuana he will do so in a safe manner.  I encouraged him to see an eye doctor yearly.  Lab work as outlined.

## 2022-11-05 NOTE — Progress Notes (Signed)
Tommi Rumps, MD Phone: [phone]  Henry Skinner is a 76 y.o. male who presents today for CPE.  Diet: lots of vegetables, 65 % of diet is vegetables Exercise: walks 4-5x/week, does lots of yard work Colonoscopy: 08/30/19 10 year recall Prostate cancer screening: aged out Family history-  Prostate cancer: no  Colon cancer: maternal uncle Vaccines-   Flu: UTD  Tetanus: UTD  Shingles: UTD  COVID19: UTD  Pneumonia: UTD  RSV: due Hep C Screening: UTD Tobacco use: no Alcohol use: 1/day Illicit Drug use: rare marijuana Dentist: yes Ophthalmology: no Patient notes occasional cramps in his right bicep if he moves it wrong.   Active Ambulatory Problems    Diagnosis Date Noted   Overweight 01/08/2016   History of kidney stones 01/08/2016   Parotid mass 02/01/2016   Actinic keratoses 07/03/2016   Essential hypertension 01/06/2017   Skin cancer 01/06/2017   BPH (benign prostatic hyperplasia) 02/17/2018   Family history of colon cancer 02/17/2018   Prediabetes 03/23/2019   Skin lesion 08/24/2019   Leg swelling 10/08/2020   Shoulder joint pain 05/06/2022   Rib pain 05/06/2022   Encounter for general adult medical examination with abnormal findings 11/05/2022   Muscle cramps 11/05/2022   Resolved Ambulatory Problems    Diagnosis Date Noted   Tick bite 02/14/2016   Headache 04/02/2016   Viral upper respiratory infection 10/30/2016   Tick bite of right upper arm 03/25/2017   Erectile dysfunction 02/17/2018   Bronchitis 12/15/2018   Food poisoning 08/24/2019   Abdominal pain 08/24/2019   Tick bite of back 02/24/2020   Need for Tdap vaccination 03/12/2021   Injury 03/12/2021   Puncture wound 03/12/2021   Past Medical History:  Diagnosis Date   Chickenpox    Hypertension    Kidney stones    Skin irritation     Family History  Problem Relation Age of Onset   Alcoholism Brother    Colon cancer Maternal Uncle     Social History   Socioeconomic History    Marital status: Single    Spouse name: Not on file   Number of children: Not on file   Years of education: Not on file   Highest education level: Not on file  Occupational History   Not on file  Tobacco Use   Smoking status: Never   Smokeless tobacco: Never  Vaping Use   Vaping Use: Never used  Substance and Sexual Activity   Alcohol use: Yes    Alcohol/week: 1.0 standard drink of alcohol    Types: 1 Standard drinks or equivalent per week    Comment: 1 beer daily   Drug use: Yes    Types: Marijuana    Comment: marijuana, rarely. Last use 11/2018   Sexual activity: Yes  Other Topics Concern   Not on file  Social History Narrative   Not on file   Social Determinants of Health   Financial Resource Strain: Low Risk  (04/01/2022)   Overall Financial Resource Strain (CARDIA)    Difficulty of Paying Living Expenses: Not hard at all  Food Insecurity: No Food Insecurity (04/01/2022)   Hunger Vital Sign    Worried About Running Out of Food in the Last Year: Never true    Ran Out of Food in the Last Year: Never true  Transportation Needs: No Transportation Needs (04/01/2022)   PRAPARE - Hydrologist (Medical): No    Lack of Transportation (Non-Medical): No  Physical Activity: Sufficiently Active (04/01/2022)  Exercise Vital Sign    Days of Exercise per Week: 5 days    Minutes of Exercise per Session: 30 min  Stress: No Stress Concern Present (04/01/2022)   [place]    Feeling of Stress : Not at all  Social Connections: Unknown (04/01/2022)   Social Connection and Isolation Panel [NHANES]    Frequency of Communication with Friends and Family: More than three times a week    Frequency of Social Gatherings with Friends and Family: More than three times a week    Attends Religious Services: Not on file    Active Member of [place] or Organizations: Not on file    Attends Archivist  Meetings: Not on file    Marital Status: Not on file  Intimate Partner Violence: Not At Risk (04/01/2022)   Humiliation, Afraid, Rape, and Kick questionnaire    Fear of Current or Ex-Partner: No    Emotionally Abused: No    Physically Abused: No    Sexually Abused: No    ROS  General:  Negative for nexplained weight loss, fever Skin: Negative for new or changing mole, sore that won't heal HEENT: Negative for trouble hearing, trouble seeing, ringing in ears, mouth sores, hoarseness, change in voice, dysphagia. CV:  Negative for chest pain, dyspnea, edema, palpitations Resp: Negative for cough, dyspnea, hemoptysis GI: Negative for nausea, vomiting, diarrhea, constipation, abdominal pain, melena, hematochezia. GU: Negative for dysuria, incontinence, urinary hesitance, hematuria, vaginal or penile discharge, polyuria, sexual difficulty, lumps in testicle or breasts MSK: Positive for muscle cramps or aches, negative for joint pain or swelling Neuro: Negative for headaches, weakness, numbness, dizziness, passing out/fainting Psych: Negative for depression, anxiety, memory problems  Objective  Physical Exam Vitals:   11/05/22 1427  BP: 130/60  Pulse: 69  Temp: 98.4 F (36.9 C)  SpO2: 98%    BP Readings from Last 3 Encounters:  11/05/22 130/60  06/04/22 138/70  05/06/22 138/78   Wt Readings from Last 3 Encounters:  11/05/22 169 lb 1.9 oz (76.7 kg)  06/04/22 170 lb 12.8 oz (77.5 kg)  05/06/22 171 lb 6.4 oz (77.7 kg)    Physical Exam Constitutional:      General: He is not in acute distress.    Appearance: He is not diaphoretic.  HENT:     Head: Normocephalic and atraumatic.  Cardiovascular:     Rate and Rhythm: Normal rate and regular rhythm.     Heart sounds: Normal heart sounds.  Pulmonary:     Effort: Pulmonary effort is normal.     Breath sounds: Normal breath sounds.  Abdominal:     General: Bowel sounds are normal. There is no distension.     Palpations:  Abdomen is soft.     Tenderness: There is no abdominal tenderness.  Musculoskeletal:     Right lower leg: No edema.     Left lower leg: No edema.  Lymphadenopathy:     Cervical: No cervical adenopathy.  Skin:    General: Skin is warm and dry.  Neurological:     Mental Status: He is alert.  Psychiatric:        Mood and Affect: Mood normal.      Assessment/Plan:   Encounter for general adult medical examination with abnormal findings Assessment & Plan: Physical exam completed.  Encouraged continued healthy diet and exercise.  Colonoscopy is up-to-date.  I encouraged the patient to get the RSV vaccine at the pharmacy.  Advised  he should not smoke marijuana due to the effects of could have on his lungs.  If he is going to continue to periodically smoke marijuana he will do so in a safe manner.  I encouraged him to see an eye doctor yearly.  Lab work as outlined.   Muscle cramps Assessment & Plan: Encouraged adequate hydration and stretching.  Will check electrolytes.  Orders: -     Basic metabolic panel -     Magnesium  Essential hypertension -     Basic metabolic panel  Prediabetes -     Hemoglobin A1c    Return in about 6 months (around 05/07/2023) for Hypertension.   Tommi Rumps, MD [place]

## 2022-11-05 NOTE — Assessment & Plan Note (Signed)
Encouraged adequate hydration and stretching.  Will check electrolytes.

## 2022-11-06 LAB — BASIC METABOLIC PANEL
BUN: 18 mg/dL (ref 6–23)
CO2: 27 mEq/L (ref 19–32)
Calcium: 9.6 mg/dL (ref 8.4–10.5)
Chloride: 103 mEq/L (ref 96–112)
Creatinine, Ser: 0.95 mg/dL (ref 0.40–1.50)
GFR: 78.84 mL/min (ref >60.00)
Glucose, Bld: 109 mg/dL — ABNORMAL HIGH (ref 70–99)
Potassium: 4.2 mEq/L (ref 3.5–5.1)
Sodium: 138 mEq/L (ref 135–145)

## 2022-11-06 LAB — MAGNESIUM: Magnesium: 2.1 mg/dL (ref 1.5–2.5)

## 2022-11-06 LAB — HEMOGLOBIN A1C: Hgb A1c MFr Bld: 5.7 % (ref 4.6–6.5)

## 2022-11-27 ENCOUNTER — Encounter: Payer: Self-pay | Admitting: Family Medicine

## 2023-02-10 DIAGNOSIS — X32XXXA Exposure to sunlight, initial encounter: Secondary | ICD-10-CM | POA: Diagnosis not present

## 2023-02-10 DIAGNOSIS — L821 Other seborrheic keratosis: Secondary | ICD-10-CM | POA: Diagnosis not present

## 2023-02-10 DIAGNOSIS — Z85828 Personal history of other malignant neoplasm of skin: Secondary | ICD-10-CM | POA: Diagnosis not present

## 2023-02-10 DIAGNOSIS — D225 Melanocytic nevi of trunk: Secondary | ICD-10-CM | POA: Diagnosis not present

## 2023-02-10 DIAGNOSIS — D2261 Melanocytic nevi of right upper limb, including shoulder: Secondary | ICD-10-CM | POA: Diagnosis not present

## 2023-02-10 DIAGNOSIS — D2272 Melanocytic nevi of left lower limb, including hip: Secondary | ICD-10-CM | POA: Diagnosis not present

## 2023-02-10 DIAGNOSIS — L570 Actinic keratosis: Secondary | ICD-10-CM | POA: Diagnosis not present

## 2023-02-10 DIAGNOSIS — D2262 Melanocytic nevi of left upper limb, including shoulder: Secondary | ICD-10-CM | POA: Diagnosis not present

## 2023-02-10 DIAGNOSIS — Q828 Other specified congenital malformations of skin: Secondary | ICD-10-CM | POA: Diagnosis not present

## 2023-02-10 DIAGNOSIS — D485 Neoplasm of uncertain behavior of skin: Secondary | ICD-10-CM | POA: Diagnosis not present

## 2023-03-17 ENCOUNTER — Telehealth: Payer: Self-pay | Admitting: Family Medicine

## 2023-03-17 NOTE — Telephone Encounter (Signed)
Copied from CRM [id number]. Topic: Medicare AWV >> Mar 17, 2023  9:10 AM Rushie Goltz wrote: Reason for CRM: Called patient to schedule Medicare Annual Wellness Visit (AWV). Left message for patient to call back and schedule Medicare Annual Wellness Visit (AWV).  Last date of AWV: 04/01/2022  [employer] (calendar year)  Please schedule an AWVS appointment at any time with [facility] VISIT.  If any questions, please contact me at [phone].   Thank you,  [facility] Support [facility] Direct dial  [phone]

## 2023-04-01 ENCOUNTER — Telehealth: Payer: Self-pay | Admitting: Family Medicine

## 2023-04-01 NOTE — Telephone Encounter (Signed)
Contacted Henry Skinner to schedule their annual wellness visit. Appointment made for 04/09/2023.  Thank you,  [facility] Support [facility] Direct dial  [phone]

## 2023-04-09 ENCOUNTER — Ambulatory Visit (INDEPENDENT_AMBULATORY_CARE_PROVIDER_SITE_OTHER): Payer: PPO

## 2023-04-09 VITALS — Ht 67.0 in | Wt 169.0 lb

## 2023-04-09 DIAGNOSIS — Z0000 Encounter for general adult medical examination without abnormal findings: Secondary | ICD-10-CM | POA: Diagnosis not present

## 2023-04-09 NOTE — Progress Notes (Signed)
Subjective:   Henry Skinner is a 77 y.o. male who presents for Medicare Annual/Subsequent preventive examination.  I connected with  [name] on 04/09/23 by a audio enabled telemedicine application and verified that I am speaking with the correct person using two identifiers.  Patient Location: Home  Provider Location: Office/Clinic  I discussed the limitations of evaluation and management by telemedicine. The patient expressed understanding and agreed to proceed.  Review of Systems     Cardiac Risk Factors include: advanced age (>43men, >24 women);male gender     Objective:    Today's Vitals   04/09/23 1235  Weight: 169 lb (76.7 kg)  Height: 5\' 7"  (1.702 m)   Body mass index is 26.47 kg/m.     04/09/2023   12:38 PM 04/01/2022    3:10 PM 03/26/2021    1:30 PM 03/23/2020    1:39 PM 02/01/2020    1:48 PM 08/30/2019    8:32 AM 03/23/2019    3:06 PM  Advanced Directives  Does Patient Have a Medical Advance Directive? No Yes No No No Yes No  Type of Special educational needs teacher of [employer] [employer];Living will    Living will   Does patient want to make changes to medical advance directive?  No - Patient declined       Copy of Healthcare Power of Attorney in Chart?  No - copy requested       Would patient like information on creating a medical advance directive? Yes (MAU/Ambulatory/Procedural Areas - Information given)  No - Patient declined Yes (MAU/Ambulatory/Procedural Areas - Information given)   No - Patient declined    Current Medications (verified) Outpatient Encounter Medications as of 04/09/2023  Medication Sig   b complex vitamins tablet Take 1 tablet by mouth daily.   cetirizine (ZYRTEC) 10 MG tablet Take 1 tablet (10 mg total) by mouth at bedtime as needed for allergies.   Elastic Bandages & Supports (MEDICAL COMPRESSION THIGH HIGH) MISC Apply in the morning and remove at bedtime.   fluticasone (FLONASE) 50 MCG/ACT nasal spray Place 2 sprays into both nostrils  daily. prn   losartan (COZAAR) 50 MG tablet TAKE 1 TABLET BY MOUTH EVERY DAY   Omega-3 Fatty Acids (FISH OIL) 500 MG CAPS Take by mouth.   sodium chloride (OCEAN) 0.65 % SOLN nasal spray Place 2 sprays into both nostrils as needed for congestion.   Specialty Vitamins Products (PROSTATE PO) Take by mouth. Force Factor Brand   TURMERIC PO Take 1 tablet by mouth at bedtime.   VITAMIN D, CHOLECALCIFEROL, PO Take 2,000 Units by mouth.   vitamin E 1000 UNIT capsule Take 1,000 Units by mouth daily.   No facility-administered encounter medications on file as of 04/09/2023.    Allergies (verified) Patient has no known allergies.   History: Past Medical History:  Diagnosis Date   Chickenpox    Hypertension    Kidney stones    Skin irritation    Past Surgical History:  Procedure Laterality Date   COLONOSCOPY WITH PROPOFOL N/A 08/30/2019   Procedure: COLONOSCOPY WITH PROPOFOL;  Surgeon: Wyline Mood, MD;  Location: [facility] ENDOSCOPY;  Service: Gastroenterology;  Laterality: N/A;   PAROTIDECTOMY Left 03/04/2016   Procedure: PAROTIDECTOMY;  Surgeon: Linus Salmons, MD;  Location: [facility] ORS;  Service: ENT;  Laterality: Left;   ROTATOR CUFF REPAIR  May 2016   SKIN SURGERY     L hand, L arm/shoulder    Family History  Problem Relation Age of Onset  Alcoholism Brother    Colon cancer Maternal Uncle    Social History   Socioeconomic History   Marital status: Single    Spouse name: Not on file   Number of children: Not on file   Years of education: Not on file   Highest education level: Not on file  Occupational History   Not on file  Tobacco Use   Smoking status: Never   Smokeless tobacco: Never  Vaping Use   Vaping Use: Never used  Substance and Sexual Activity   Alcohol use: Yes    Alcohol/week: 1.0 standard drink of alcohol    Types: 1 Standard drinks or equivalent per week    Comment: 1 beer daily   Drug use: Yes    Types: Marijuana    Comment: marijuana, rarely. Last use  11/2018   Sexual activity: Yes  Other Topics Concern   Not on file  Social History Narrative   Not on file   Social Determinants of Health   Financial Resource Strain: Low Risk  (04/09/2023)   Overall Financial Resource Strain (CARDIA)    Difficulty of Paying Living Expenses: Not hard at all  Food Insecurity: No Food Insecurity (04/09/2023)   Hunger Vital Sign    Worried About Running Out of Food in the Last Year: Never true    Ran Out of Food in the Last Year: Never true  Transportation Needs: No Transportation Needs (04/09/2023)   PRAPARE - Administrator, Civil Service (Medical): No    Lack of Transportation (Non-Medical): No  Physical Activity: Sufficiently Active (04/09/2023)   Exercise Vital Sign    Days of Exercise per Week: 5 days    Minutes of Exercise per Session: 30 min  Stress: No Stress Concern Present (04/09/2023)   [employer] of Occupational Health - Occupational Stress Questionnaire    Feeling of Stress : Not at all  Social Connections: Moderately Isolated (04/09/2023)   Social Connection and Isolation Panel [NHANES]    Frequency of Communication with Friends and Family: More than three times a week    Frequency of Social Gatherings with Friends and Family: Three times a week    Attends Religious Services: 1 to 4 times per year    Active Member of Clubs or Organizations: No    Attends Banker Meetings: Never    Marital Status: Never married    Tobacco Counseling Counseling given: Not Answered   Clinical Intake:  Pre-visit preparation completed: Yes  Pain : No/denies pain  Diabetes: No  How often do you need to have someone help you when you read instructions, pamphlets, or other written materials from your doctor or pharmacy?: 1 - Never  Diabetic?No   Interpreter Needed?: No  Information entered by :: Kandis Fantasia LPN   Activities of Daily Living    04/09/2023   12:38 PM  In your present state of health, do you  have any difficulty performing the following activities:  Hearing? 0  Vision? 0  Difficulty concentrating or making decisions? 0  Walking or climbing stairs? 0  Dressing or bathing? 0  Doing errands, shopping? 0  Preparing Food and eating ? N  Using the Toilet? N  In the past six months, have you accidently leaked urine? N  Do you have problems with loss of bowel control? N  Managing your Medications? N  Managing your Finances? N  Housekeeping or managing your Housekeeping? N    Patient Care Team: Glori Luis, MD  as PCP - General (Family Medicine)  Indicate any recent Medical Services you may have received from other than [facility] providers in the past year (date may be approximate).     Assessment:   This is a routine wellness examination for [place].  Hearing/Vision screen Hearing Screening - Comments:: Denies hearing difficulties   Vision Screening - Comments:: No vision problems; will schedule routine eye exam soon    Dietary issues and exercise activities discussed: Current Exercise Habits: Home exercise routine, Type of exercise: walking;stretching, Time (Minutes): 30, Frequency (Times/Week): 5, Weekly Exercise (Minutes/Week): 150, Intensity: Mild   Goals Addressed   None   Depression Screen    04/09/2023   12:36 PM 11/05/2022    2:30 PM 06/04/2022    1:23 PM 04/01/2022    3:08 PM 11/05/2021   10:37 AM 03/26/2021    1:28 PM 03/12/2021    4:11 PM  PHQ 2/9 Scores  PHQ - 2 Score 0 0 0 0 0 0 0  PHQ- 9 Score  0         Fall Risk    04/09/2023   12:48 PM 11/05/2022    2:30 PM 06/04/2022    1:23 PM 04/01/2022    3:11 PM 11/05/2021   10:35 AM  Fall Risk   Falls in the past year? 0 0 0 0 0  Number falls in past yr: 0 0  0 0  Injury with Fall? 0 0   0  Risk for fall due to : No Fall Risks No Fall Risks   No Fall Risks  Follow up Falls prevention discussed;Education provided;Falls evaluation completed Falls evaluation completed  Falls evaluation completed Falls  evaluation completed    FALL RISK PREVENTION PERTAINING TO THE HOME:  Any stairs in or around the home? No  If so, are there any without handrails? No  Home free of loose throw rugs in walkways, pet beds, electrical cords, etc? Yes  Adequate lighting in your home to reduce risk of falls? Yes   ASSISTIVE DEVICES UTILIZED TO PREVENT FALLS:  Life alert? No  Use of a cane, walker or w/c? No  Grab bars in the bathroom? Yes  Shower chair or bench in shower? No  Elevated toilet seat or a handicapped toilet? Yes   TIMED UP AND GO:  Was the test performed? No . Telephonic visit   Cognitive Function:    03/19/2018    2:45 PM 03/18/2017    2:47 PM  MMSE - Mini Mental State Exam  Orientation to time 5 5  Orientation to Place 5 5  Registration 3 3  Attention/ Calculation 5 5  Recall 3 3  Language- name 2 objects 2 2  Language- repeat 1 1  Language- follow 3 step command 3 3  Language- read & follow direction 1 1  Write a sentence 1 1  Copy design 1 1  Total score 30 30        04/09/2023   12:41 PM 04/01/2022    3:14 PM 03/26/2021    1:35 PM 03/23/2020    1:56 PM 03/23/2019    3:08 PM  6CIT Screen  What Year? 0 points 0 points 0 points 0 points 0 points  What month? 0 points 0 points 0 points 0 points 0 points  What time? 0 points 0 points 0 points 0 points 0 points  Count back from 20 0 points 0 points 0 points 0 points 0 points  Months in reverse 0 points 0  points 0 points 0 points 0 points  Repeat phrase 0 points 0 points 0 points 0 points 0 points  Total Score 0 points 0 points 0 points 0 points 0 points    Immunizations Immunization History  Administered Date(s) Administered   COVID-19, mRNA, vaccine(Comirnaty)12 years and older 08/23/2022, 01/27/2023   Fluad Quad(high Dose 65+) 07/17/2022   Influenza, High Dose Seasonal PF 11/09/2015, 09/10/2016, 08/19/2017, 08/09/2018, 07/05/2019, 07/05/2019, 07/31/2020, 06/26/2021   Influenza,inj,quad, With Preservative 08/22/2014    Influenza-Unspecified 10/20/2016   PFIZER Comirnaty(Gray Top)Covid-19 Tri-Sucrose Vaccine 03/15/2021   PFIZER(Purple Top)SARS-COV-2 Vaccination 12/27/2019, 01/17/2020, 08/14/2020   PNEUMOCOCCAL CONJUGATE-20 06/26/2021   Pfizer Covid-19 Vaccine Bivalent Booster 39yrs & up 09/06/2021   Pneumococcal Conjugate-13 12/25/2016   Pneumococcal Polysaccharide-23 03/19/2018   Td 03/12/2021   Tdap 01/08/2016   Zoster Recombinat (Shingrix) 06/08/2019, 09/23/2019   Zoster, Live 12/18/2006    TDAP status: Up to date  Pneumococcal vaccine status: Up to date  Covid-19 vaccine status: Information provided on how to obtain vaccines.   Qualifies for Shingles Vaccine? Yes   Zostavax completed Yes   Shingrix Completed?: Yes  Screening Tests Health Maintenance  Topic Date Due   COVID-19 Vaccine (8 - 2023-24 season) 03/24/2023   INFLUENZA VACCINE  06/18/2023   Medicare Annual Wellness (AWV)  04/08/2024   COLONOSCOPY (Pts 45-47yrs Insurance coverage will need to be confirmed)  08/29/2029   DTaP/Tdap/Td (3 - Td or Tdap) 03/13/2031   Pneumonia Vaccine 92+ Years old  Completed   Hepatitis C Screening  Completed   Zoster Vaccines- Shingrix  Completed   HPV VACCINES  Aged Out    Health Maintenance  Health Maintenance Due  Topic Date Due   COVID-19 Vaccine (8 - 2023-24 season) 03/24/2023    Colorectal cancer screening: Type of screening: Colonoscopy. Completed 08/30/19. Repeat every 10 years  Lung Cancer Screening: (Low Dose CT Chest recommended if Age 75-80 years, 30 pack-year currently smoking OR have quit w/in 15years.) does not qualify.   Lung Cancer Screening Referral: n/a  Additional Screening:  Hepatitis C Screening: does qualify; Completed 12/19/11  Vision Screening: Recommended annual ophthalmology exams for early detection of glaucoma and other disorders of the eye. Is the patient up to date with their annual eye exam?  No Who is the provider or what is the name of the office in  which the patient attends annual eye exams? none If pt is not established with a provider, would they like to be referred to a provider to establish care? No .   Dental Screening: Recommended annual dental exams for proper oral hygiene  Community Resource Referral / Chronic Care Management: CRR required this visit?  No   CCM required this visit?  No      Plan:     I have personally reviewed and noted the following in the patient's chart:   Medical and social history Use of alcohol, tobacco or illicit drugs  Current medications and supplements including opioid prescriptions. Patient is not currently taking opioid prescriptions. Functional ability and status Nutritional status Physical activity Advanced directives List of other physicians Hospitalizations, surgeries, and ER visits in previous 12 months Vitals Screenings to include cognitive, depression, and falls Referrals and appointments  In addition, I have reviewed and discussed with patient certain preventive protocols, quality metrics, and best practice recommendations. A written personalized care plan for preventive services as well as general preventive health recommendations were provided to patient.     Kandis Fantasia [place], [place]   [id number]  Due to this being a virtual visit, the after visit summary with patients personalized plan was offered to patient via mail or my-chart.  per request, patient was mailed a copy of AVS.  Nurse Notes: No concerns

## 2023-04-09 NOTE — Patient Instructions (Signed)
[name] , Thank you for taking time to come for your Medicare Wellness Visit. I appreciate your ongoing commitment to your health goals. Please review the following plan we discussed and let me know if I can assist you in the future.   These are the goals we discussed:  Goals       DIET - REDUCE SUGAR INTAKE (pt-stated)      Continue reducing sugar intake; monitor diet      Increase physical activity (pt-stated)      Walk more for exercise         This is a list of the screening recommended for you and due dates:  Health Maintenance  Topic Date Due   COVID-19 Vaccine (8 - 2023-24 season) 03/24/2023   Flu Shot  06/18/2023   Medicare Annual Wellness Visit  04/08/2024   Colon Cancer Screening  08/29/2029   DTaP/Tdap/Td vaccine (3 - Td or Tdap) 03/13/2031   Pneumonia Vaccine  Completed   Hepatitis C Screening: USPSTF Recommendation to screen - Ages 73-79 yo.  Completed   Zoster (Shingles) Vaccine  Completed   HPV Vaccine  Aged Out    Advanced directives: Information on Advanced Care Planning can be found at [facility] Advance Health Care Directives Advance Health Care Directives ([email or web address]   Conditions/risks identified: Aim for 30 minutes of exercise or brisk walking, 6-8 glasses of water, and 5 servings of fruits and vegetables each day.  Next appointment: Follow up in one year for your annual wellness visit.   Preventive Care 8 Years and Older, Male  Preventive care refers to lifestyle choices and visits with your health care provider that can promote health and wellness. What does preventive care include? A yearly physical exam. This is also called an annual well check. Dental exams once or twice a year. Routine eye exams. Ask your health care provider how often you should have your eyes checked. Personal lifestyle choices, including: Daily care of your teeth and gums. Regular physical activity. Eating a healthy diet. Avoiding tobacco and  drug use. Limiting alcohol use. Practicing safe sex. Taking low doses of aspirin every day. Taking vitamin and mineral supplements as recommended by your health care provider. What happens during an annual well check? The services and screenings done by your health care provider during your annual well check will depend on your age, overall health, lifestyle risk factors, and family history of disease. Counseling  Your health care provider may ask you questions about your: Alcohol use. Tobacco use. Drug use. Emotional well-being. Home and relationship well-being. Sexual activity. Eating habits. History of falls. Memory and ability to understand (cognition). Work and work Astronomer. Screening  You may have the following tests or measurements: Height, weight, and BMI. Blood pressure. Lipid and cholesterol levels. These may be checked every 5 years, or more frequently if you are over 37 years old. Skin check. Lung cancer screening. You may have this screening every year starting at age 12 if you have a 30-pack-year history of smoking and currently smoke or have quit within the past 15 years. Fecal occult blood test (FOBT) of the stool. You may have this test every year starting at age 19. Flexible sigmoidoscopy or colonoscopy. You may have a sigmoidoscopy every 5 years or a colonoscopy every 10 years starting at age 26. Prostate cancer screening. Recommendations will vary depending on your family history and other risks. Hepatitis C blood test. Hepatitis B blood test. Sexually transmitted disease (STD) testing.  Diabetes screening. This is done by checking your blood sugar (glucose) after you have not eaten for a while (fasting). You may have this done every 1-3 years. Abdominal aortic aneurysm (AAA) screening. You may need this if you are a current or former smoker. Osteoporosis. You may be screened starting at age 58 if you are at high risk. Talk with your health care provider  about your test results, treatment options, and if necessary, the need for more tests. Vaccines  Your health care provider may recommend certain vaccines, such as: Influenza vaccine. This is recommended every year. Tetanus, diphtheria, and acellular pertussis (Tdap, Td) vaccine. You may need a Td booster every 10 years. Zoster vaccine. You may need this after age 80. Pneumococcal 13-valent conjugate (PCV13) vaccine. One dose is recommended after age 81. Pneumococcal polysaccharide (PPSV23) vaccine. One dose is recommended after age 103. Talk to your health care provider about which screenings and vaccines you need and how often you need them. This information is not intended to replace advice given to you by your health care provider. Make sure you discuss any questions you have with your health care provider. Document Released: 11/30/2015 Document Revised: 07/23/2016 Document Reviewed: 09/04/2015 Elsevier Interactive Patient Education  2017 [employer].  Fall Prevention in the Home Falls can cause injuries. They can happen to people of all ages. There are many things you can do to make your home safe and to help prevent falls. What can I do on the outside of my home? Regularly fix the edges of walkways and driveways and fix any cracks. Remove anything that might make you trip as you walk through a door, such as a raised step or threshold. Trim any bushes or trees on the path to your home. Use bright outdoor lighting. Clear any walking paths of anything that might make someone trip, such as rocks or tools. Regularly check to see if handrails are loose or broken. Make sure that both sides of any steps have handrails. Any raised decks and porches should have guardrails on the edges. Have any leaves, snow, or ice cleared regularly. Use sand or salt on walking paths during winter. Clean up any spills in your garage right away. This includes oil or grease spills. What can I do in the  bathroom? Use night lights. Install grab bars by the toilet and in the tub and shower. Do not use towel bars as grab bars. Use non-skid mats or decals in the tub or shower. If you need to sit down in the shower, use a plastic, non-slip stool. Keep the floor dry. Clean up any water that spills on the floor as soon as it happens. Remove soap buildup in the tub or shower regularly. Attach bath mats securely with double-sided non-slip rug tape. Do not have throw rugs and other things on the floor that can make you trip. What can I do in the bedroom? Use night lights. Make sure that you have a light by your bed that is easy to reach. Do not use any sheets or blankets that are too big for your bed. They should not hang down onto the floor. Have a firm chair that has side arms. You can use this for support while you get dressed. Do not have throw rugs and other things on the floor that can make you trip. What can I do in the kitchen? Clean up any spills right away. Avoid walking on wet floors. Keep items that you use a lot in easy-to-reach  places. If you need to reach something above you, use a strong step stool that has a grab bar. Keep electrical cords out of the way. Do not use floor polish or wax that makes floors slippery. If you must use wax, use non-skid floor wax. Do not have throw rugs and other things on the floor that can make you trip. What can I do with my stairs? Do not leave any items on the stairs. Make sure that there are handrails on both sides of the stairs and use them. Fix handrails that are broken or loose. Make sure that handrails are as long as the stairways. Check any carpeting to make sure that it is firmly attached to the stairs. Fix any carpet that is loose or worn. Avoid having throw rugs at the top or bottom of the stairs. If you do have throw rugs, attach them to the floor with carpet tape. Make sure that you have a light switch at the top of the stairs and the  bottom of the stairs. If you do not have them, ask someone to add them for you. What else can I do to help prevent falls? Wear shoes that: Do not have high heels. Have rubber bottoms. Are comfortable and fit you well. Are closed at the toe. Do not wear sandals. If you use a stepladder: Make sure that it is fully opened. Do not climb a closed stepladder. Make sure that both sides of the stepladder are locked into place. Ask someone to hold it for you, if possible. Clearly mark and make sure that you can see: Any grab bars or handrails. First and last steps. Where the edge of each step is. Use tools that help you move around (mobility aids) if they are needed. These include: Canes. Walkers. Scooters. Crutches. Turn on the lights when you go into a dark area. Replace any light bulbs as soon as they burn out. Set up your furniture so you have a clear path. Avoid moving your furniture around. If any of your floors are uneven, fix them. If there are any pets around you, be aware of where they are. Review your medicines with your doctor. Some medicines can make you feel dizzy. This can increase your chance of falling. Ask your doctor what other things that you can do to help prevent falls. This information is not intended to replace advice given to you by your health care provider. Make sure you discuss any questions you have with your health care provider. Document Released: 08/30/2009 Document Revised: 04/10/2016 Document Reviewed: 12/08/2014 Elsevier Interactive Patient Education  2017 [employer].

## 2023-04-14 NOTE — Progress Notes (Signed)
Has been signed.  Thanks.

## 2023-05-12 ENCOUNTER — Ambulatory Visit: Payer: PPO | Admitting: Family Medicine

## 2023-05-13 ENCOUNTER — Encounter: Payer: Self-pay | Admitting: Family Medicine

## 2023-05-13 ENCOUNTER — Ambulatory Visit (INDEPENDENT_AMBULATORY_CARE_PROVIDER_SITE_OTHER): Payer: PPO | Admitting: Family Medicine

## 2023-05-13 VITALS — BP 138/86 | HR 75 | Temp 98.1°F | Ht 67.0 in | Wt 167.8 lb

## 2023-05-13 DIAGNOSIS — I10 Essential (primary) hypertension: Secondary | ICD-10-CM | POA: Diagnosis not present

## 2023-05-13 DIAGNOSIS — R7303 Prediabetes: Secondary | ICD-10-CM | POA: Diagnosis not present

## 2023-05-13 NOTE — Assessment & Plan Note (Signed)
Chronic issue.  Adequately controlled for age.  He will continue losartan 50 mg daily.  Check labs today.

## 2023-05-13 NOTE — Assessment & Plan Note (Signed)
Chronic issue.  Back into the prediabetic range on his last visit.  Check A1c today.  Continue bread reduction.  Discussed appropriate bread serving size per meal.

## 2023-05-13 NOTE — Progress Notes (Signed)
Marikay Alar, MD Phone: [phone]  Henry Skinner is a 77 y.o. male who presents today for f/u.  HYPERTENSION Disease Monitoring Home BP Monitoring not checking Chest pain- no    Dyspnea- no Medications Compliance-  taking losartan.   Edema- no BMET    Component Value Date/Time   NA 138 11/05/2022 1448   NA 141 09/11/2020 1103   K 4.2 11/05/2022 1448   CL 103 11/05/2022 1448   CO2 27 11/05/2022 1448   GLUCOSE 109 (H) 11/05/2022 1448   BUN 18 11/05/2022 1448   BUN 14 09/11/2020 1103   CREATININE 0.95 11/05/2022 1448   CREATININE 0.95 02/24/2020 1442   CALCIUM 9.6 11/05/2022 1448   GFRNONAA 91 09/11/2020 1103   GFRAA 105 09/11/2020 1103   Prediabetes: Patient notes no polyuria or polydipsia.  He has reduced his bread intake.  Also eating salmon and sardines as well as more salads.  He started back walking 1.5 miles daily.  He had a break in his walking in April related to having a respiratory illness.  His respiratory symptoms have resolved at this time.  Social History   Tobacco Use  Smoking Status Never  Smokeless Tobacco Never    Current Outpatient Medications on File Prior to Visit  Medication Sig Dispense Refill   b complex vitamins tablet Take 1 tablet by mouth daily.     Elastic Bandages & Supports (MEDICAL COMPRESSION THIGH HIGH) MISC Apply in the morning and remove at bedtime. 2 each 3   losartan (COZAAR) 50 MG tablet TAKE 1 TABLET BY MOUTH EVERY DAY 90 tablet 3   Omega-3 Fatty Acids (FISH OIL) 500 MG CAPS Take by mouth.     sodium chloride (OCEAN) 0.65 % SOLN nasal spray Place 2 sprays into both nostrils as needed for congestion. 30 mL 2   Specialty Vitamins Products (PROSTATE PO) Take by mouth. Force Factor Brand     TURMERIC PO Take 1 tablet by mouth at bedtime.     VITAMIN D, CHOLECALCIFEROL, PO Take 2,000 Units by mouth.     vitamin E 1000 UNIT capsule Take 1,000 Units by mouth daily.     No current facility-administered medications on file prior to  visit.     ROS see history of present illness  Objective  Physical Exam Vitals:   05/13/23 1341  BP: 138/86  Pulse: 75  Temp: 98.1 F (36.7 C)  SpO2: 96%    BP Readings from Last 3 Encounters:  05/13/23 138/86  11/05/22 130/60  06/04/22 138/70   Wt Readings from Last 3 Encounters:  05/13/23 167 lb 12.8 oz (76.1 kg)  04/09/23 169 lb (76.7 kg)  11/05/22 169 lb 1.9 oz (76.7 kg)    Physical Exam Constitutional:      General: He is not in acute distress.    Appearance: He is not diaphoretic.  Cardiovascular:     Rate and Rhythm: Normal rate and regular rhythm.     Heart sounds: Normal heart sounds.  Pulmonary:     Effort: Pulmonary effort is normal.     Breath sounds: Normal breath sounds.  Skin:    General: Skin is warm and dry.  Neurological:     Mental Status: He is alert.      Assessment/Plan: Please see individual problem list.  Essential hypertension Assessment & Plan: Chronic issue.  Adequately controlled for age.  He will continue losartan 50 mg daily.  Check labs today.  Orders: -     Comprehensive metabolic panel -  Lipid panel  Prediabetes Assessment & Plan: Chronic issue.  Back into the prediabetic range on his last visit.  Check A1c today.  Continue bread reduction.  Discussed appropriate bread serving size per meal.  Orders: -     Hemoglobin A1c   Return in about 6 months (around 11/12/2023).   Marikay Alar, MD [facility]

## 2023-05-14 LAB — COMPREHENSIVE METABOLIC PANEL
ALT: 24 U/L (ref 0–53)
AST: 21 U/L (ref 0–37)
Albumin: 4.3 g/dL (ref 3.5–5.2)
Alkaline Phosphatase: 57 U/L (ref 39–117)
BUN: 20 mg/dL (ref 6–23)
CO2: 28 mEq/L (ref 19–32)
Calcium: 10 mg/dL (ref 8.4–10.5)
Chloride: 104 mEq/L (ref 96–112)
Creatinine, Ser: 1.27 mg/dL (ref 0.40–1.50)
GFR: 55.44 mL/min — ABNORMAL LOW (ref >60.00)
Glucose, Bld: 98 mg/dL (ref 70–99)
Potassium: 4.2 mEq/L (ref 3.5–5.1)
Sodium: 141 mEq/L (ref 135–145)
Total Bilirubin: 1 mg/dL (ref 0.2–1.2)
Total Protein: 7.4 g/dL (ref 6.0–8.3)

## 2023-05-14 LAB — LIPID PANEL
Cholesterol: 146 mg/dL (ref 0–200)
HDL: 47.7 mg/dL (ref >39.00)
LDL Cholesterol: 75 mg/dL (ref 0–99)
NonHDL: 98.49
Total CHOL/HDL Ratio: 3
Triglycerides: 115 mg/dL (ref 0.0–149.0)
VLDL: 23 mg/dL (ref 0.0–40.0)

## 2023-05-14 LAB — HEMOGLOBIN A1C: Hgb A1c MFr Bld: 5.5 % (ref 4.6–6.5)

## 2023-05-15 ENCOUNTER — Telehealth: Payer: Self-pay

## 2023-05-15 NOTE — Telephone Encounter (Signed)
-----   Message from Eric G Sonnenberg, MD sent at 05/15/2023 10:57 AM EDT ----- Please let the patient know that his kidney function is little worse than his baseline.  How much water has he been drinking?  Has he been taking any ibuprofen or Aleve?  Has he been taking any new supplements?  His other lab work is acceptable. 

## 2023-05-15 NOTE — Telephone Encounter (Signed)
Noted I would suggest increasing his water intake and recheck his BMP in 2 weeks.

## 2023-05-15 NOTE — Telephone Encounter (Signed)
Left message to call the office back due to Dr. Birdie Sons recommending to increase his water intake and rechecking his bmp lab in 2 weeks. Patient needs to be scheduled.

## 2023-05-15 NOTE — Telephone Encounter (Signed)
Patient states he will start drinking more water and he is trying to stop drinking beer. Patient also wanted you to know that he was sleeping 3 hours then waking up but now he chews on fresh ginger and it puts him to sleep and he sleeps all night. Patient is scheduled for a bmp recheck on 06/02/23.

## 2023-05-15 NOTE — Telephone Encounter (Signed)
Left message to call the office back regarding his lab results. 

## 2023-05-15 NOTE — Telephone Encounter (Signed)
Printed lab results and put in out going mail to be mailed to the patient per his request.

## 2023-05-15 NOTE — Telephone Encounter (Signed)
Pt called back and I read the message to him and he states he drinks a half a gallon a day of water and he has not been taking any ibuprofen or aleve. Pt states he has been drinking beat juice and no other supplements. Pt would like his lab results mailed to him

## 2023-05-15 NOTE — Telephone Encounter (Signed)
Noted  

## 2023-05-18 ENCOUNTER — Telehealth: Payer: Self-pay | Admitting: *Deleted

## 2023-05-18 DIAGNOSIS — N179 Acute kidney failure, unspecified: Secondary | ICD-10-CM

## 2023-05-18 NOTE — Telephone Encounter (Signed)
Left voicemail to return call (second attempt)

## 2023-05-18 NOTE — Telephone Encounter (Signed)
-----   Message from Glori Luis, MD sent at 05/15/2023 10:57 AM EDT ----- Please let the patient know that his kidney function is little worse than his baseline.  How much water has he been drinking?  Has he been taking any ibuprofen or Aleve?  Has he been taking any new supplements?  His other lab work is acceptable.

## 2023-05-19 NOTE — Telephone Encounter (Signed)
Patient states he is returning our call.  I read Dr. Bernardo Heater message to patient.  Patient states he drinks about 3/4 gallon of liquid per day.  Patient states he is drinking water, coconut juice, a cup of coffee, a beer, pre and pro biotics.  Patient states he rarely drinks a soda.  Patient states he is trying to reserve his sugar intake for oat cream.  Patient states he tries to drink non-sugar and non-caffeinated tea.  March/April patient states he took a couple of ticks off himself, but they did not have a circle on them.  Patient states he is getting mosquito bites.  Patient states he does not take ibuprofen or aleve, except as needed.  Patient states he did have some a couple of months ago and he had 2-3 doses at the most over a day or two.  Patient states he has not been taking any new supplements, but he does take a bite of raw ginger when he wakes up after sleeping about 2-3 hours and his stomach is bothering him.    Patient states he would like for [place] to please mail a copy of his lab results to him.  I verified that we do have his correct mailing address on file.

## 2023-05-20 ENCOUNTER — Telehealth: Payer: Self-pay | Admitting: *Deleted

## 2023-05-20 NOTE — Telephone Encounter (Signed)
LMTCB

## 2023-05-20 NOTE — Telephone Encounter (Signed)
Left message to call the office back regarding Dr. Purvis Sheffield recommendation to maintain adequate hydration. We will need to recheck his kidney function next week to see if it has returned to his baseline and to make sure it is not worsening.

## 2023-05-20 NOTE — Telephone Encounter (Signed)
Left message to call office

## 2023-05-20 NOTE — Telephone Encounter (Signed)
Printed lab results and put then in the mail per Patient request.

## 2023-05-20 NOTE — Telephone Encounter (Signed)
-----   Message from Glori Luis, MD sent at 05/15/2023 10:57 AM EDT ----- Please let the patient know that his kidney function is little worse than his baseline.  How much water has he been drinking?  Has he been taking any ibuprofen or Aleve?  Has he been taking any new supplements?  His other lab work is acceptable.

## 2023-05-20 NOTE — Telephone Encounter (Signed)
Noted. He should maintain adequate hydration. We will need to recheck his kidney function next week to see if it has returned to his baseline and to make sure it is not worsening.

## 2023-05-22 NOTE — Telephone Encounter (Signed)
Sending note to Henrene Pastor RN.

## 2023-05-22 NOTE — Telephone Encounter (Signed)
This message was handled by Juliet Rude and Dr. Birdie Sons.

## 2023-05-22 NOTE — Addendum Note (Signed)
Addended by: Glori Luis on: 05/22/2023 08:48 AM   Modules accepted: Orders

## 2023-05-22 NOTE — Addendum Note (Signed)
Addended by: Prince Solian A on: 05/22/2023 08:42 AM   Modules accepted: Orders

## 2023-05-22 NOTE — Telephone Encounter (Signed)
This message has been handled by Juliet Rude and Dr. Birdie Sons.

## 2023-05-26 ENCOUNTER — Other Ambulatory Visit (INDEPENDENT_AMBULATORY_CARE_PROVIDER_SITE_OTHER): Payer: PPO

## 2023-05-26 DIAGNOSIS — N179 Acute kidney failure, unspecified: Secondary | ICD-10-CM | POA: Diagnosis not present

## 2023-05-27 LAB — BASIC METABOLIC PANEL
BUN: 17 mg/dL (ref 6–23)
CO2: 30 mEq/L (ref 19–32)
Calcium: 9.8 mg/dL (ref 8.4–10.5)
Chloride: 103 mEq/L (ref 96–112)
Creatinine, Ser: 1.08 mg/dL (ref 0.40–1.50)
GFR: 67.33 mL/min (ref >60.00)
Glucose, Bld: 100 mg/dL — ABNORMAL HIGH (ref 70–99)
Potassium: 4.6 mEq/L (ref 3.5–5.1)
Sodium: 140 mEq/L (ref 135–145)

## 2023-06-01 ENCOUNTER — Telehealth: Payer: Self-pay

## 2023-06-01 NOTE — Telephone Encounter (Signed)
-----   Message from Marikay Alar sent at 05/27/2023 11:02 AM EDT ----- Please let the patient know that his kidney function has improved some.  He should continue with adequate hydration.  We will plan on rechecking this at his next visit.

## 2023-06-01 NOTE — Telephone Encounter (Signed)
Left message to call back regarding Dr. Purvis Sheffield recommendation below.

## 2023-06-01 NOTE — Telephone Encounter (Signed)
 Pt called back and I read the message to him and he understood 

## 2023-06-02 ENCOUNTER — Other Ambulatory Visit: Payer: PPO

## 2023-06-17 DIAGNOSIS — D0462 Carcinoma in situ of skin of left upper limb, including shoulder: Secondary | ICD-10-CM | POA: Diagnosis not present

## 2023-06-17 DIAGNOSIS — L570 Actinic keratosis: Secondary | ICD-10-CM | POA: Diagnosis not present

## 2023-06-17 DIAGNOSIS — D485 Neoplasm of uncertain behavior of skin: Secondary | ICD-10-CM | POA: Diagnosis not present

## 2023-06-18 NOTE — Telephone Encounter (Signed)
Error

## 2023-07-16 ENCOUNTER — Encounter: Payer: Self-pay | Admitting: Family Medicine

## 2023-07-22 DIAGNOSIS — D485 Neoplasm of uncertain behavior of skin: Secondary | ICD-10-CM | POA: Diagnosis not present

## 2023-07-22 DIAGNOSIS — D0462 Carcinoma in situ of skin of left upper limb, including shoulder: Secondary | ICD-10-CM | POA: Diagnosis not present

## 2023-08-18 DIAGNOSIS — Z85828 Personal history of other malignant neoplasm of skin: Secondary | ICD-10-CM | POA: Diagnosis not present

## 2023-08-18 DIAGNOSIS — D2272 Melanocytic nevi of left lower limb, including hip: Secondary | ICD-10-CM | POA: Diagnosis not present

## 2023-08-18 DIAGNOSIS — D0462 Carcinoma in situ of skin of left upper limb, including shoulder: Secondary | ICD-10-CM | POA: Diagnosis not present

## 2023-08-18 DIAGNOSIS — D2262 Melanocytic nevi of left upper limb, including shoulder: Secondary | ICD-10-CM | POA: Diagnosis not present

## 2023-08-18 DIAGNOSIS — D0439 Carcinoma in situ of skin of other parts of face: Secondary | ICD-10-CM | POA: Diagnosis not present

## 2023-08-18 DIAGNOSIS — D2271 Melanocytic nevi of right lower limb, including hip: Secondary | ICD-10-CM | POA: Diagnosis not present

## 2023-08-18 DIAGNOSIS — C44219 Basal cell carcinoma of skin of left ear and external auricular canal: Secondary | ICD-10-CM | POA: Diagnosis not present

## 2023-08-18 DIAGNOSIS — D485 Neoplasm of uncertain behavior of skin: Secondary | ICD-10-CM | POA: Diagnosis not present

## 2023-08-18 DIAGNOSIS — D0471 Carcinoma in situ of skin of right lower limb, including hip: Secondary | ICD-10-CM | POA: Diagnosis not present

## 2023-08-18 DIAGNOSIS — D2261 Melanocytic nevi of right upper limb, including shoulder: Secondary | ICD-10-CM | POA: Diagnosis not present

## 2023-08-18 DIAGNOSIS — L570 Actinic keratosis: Secondary | ICD-10-CM | POA: Diagnosis not present

## 2023-09-01 DIAGNOSIS — D0471 Carcinoma in situ of skin of right lower limb, including hip: Secondary | ICD-10-CM | POA: Diagnosis not present

## 2023-09-09 ENCOUNTER — Other Ambulatory Visit: Payer: Self-pay | Admitting: Family Medicine

## 2023-09-09 DIAGNOSIS — I10 Essential (primary) hypertension: Secondary | ICD-10-CM

## 2023-09-10 DIAGNOSIS — C44319 Basal cell carcinoma of skin of other parts of face: Secondary | ICD-10-CM | POA: Diagnosis not present

## 2023-09-24 DIAGNOSIS — D0439 Carcinoma in situ of skin of other parts of face: Secondary | ICD-10-CM | POA: Diagnosis not present

## 2023-09-30 DIAGNOSIS — H35033 Hypertensive retinopathy, bilateral: Secondary | ICD-10-CM | POA: Diagnosis not present

## 2023-10-07 DIAGNOSIS — L821 Other seborrheic keratosis: Secondary | ICD-10-CM | POA: Diagnosis not present

## 2023-10-07 DIAGNOSIS — Z85828 Personal history of other malignant neoplasm of skin: Secondary | ICD-10-CM | POA: Diagnosis not present

## 2023-10-07 DIAGNOSIS — L820 Inflamed seborrheic keratosis: Secondary | ICD-10-CM | POA: Diagnosis not present

## 2023-10-07 DIAGNOSIS — L2989 Other pruritus: Secondary | ICD-10-CM | POA: Diagnosis not present

## 2023-10-07 DIAGNOSIS — L538 Other specified erythematous conditions: Secondary | ICD-10-CM | POA: Diagnosis not present

## 2023-10-07 DIAGNOSIS — Z08 Encounter for follow-up examination after completed treatment for malignant neoplasm: Secondary | ICD-10-CM | POA: Diagnosis not present

## 2023-10-07 DIAGNOSIS — L570 Actinic keratosis: Secondary | ICD-10-CM | POA: Diagnosis not present

## 2023-11-17 ENCOUNTER — Ambulatory Visit: Payer: PPO | Admitting: Family Medicine

## 2023-11-25 ENCOUNTER — Ambulatory Visit: Payer: PPO | Admitting: Family Medicine

## 2023-11-25 VITALS — BP 130/74 | HR 65 | Temp 97.7°F | Ht 67.0 in | Wt 161.4 lb

## 2023-11-25 DIAGNOSIS — R7303 Prediabetes: Secondary | ICD-10-CM | POA: Diagnosis not present

## 2023-11-25 DIAGNOSIS — I10 Essential (primary) hypertension: Secondary | ICD-10-CM

## 2023-11-25 LAB — BASIC METABOLIC PANEL
BUN: 21 mg/dL (ref 6–23)
CO2: 30 meq/L (ref 19–32)
Calcium: 9.5 mg/dL (ref 8.4–10.5)
Chloride: 101 meq/L (ref 96–112)
Creatinine, Ser: 0.88 mg/dL (ref 0.40–1.50)
GFR: 84.07 mL/min (ref >60.00)
Glucose, Bld: 99 mg/dL (ref 70–99)
Potassium: 3.9 meq/L (ref 3.5–5.1)
Sodium: 139 meq/L (ref 135–145)

## 2023-11-25 LAB — HEMOGLOBIN A1C: Hgb A1c MFr Bld: 5.8 % (ref 4.6–6.5)

## 2023-11-25 NOTE — Assessment & Plan Note (Signed)
 Chronic issue.  Adequately controlled for his age.  He will continue losartan 50 mg daily.  Check labs.  Continue diet and exercise.

## 2023-11-25 NOTE — Assessment & Plan Note (Signed)
 Chronic issue.  Most recent A1c was in the normal range.  Recheck today.  Encouraged continued healthy diet and exercise.

## 2023-11-25 NOTE — Progress Notes (Signed)
 Camellia Her, MD Phone: [phone]  Henry Skinner is a 78 y.o. male who presents today for f/u.  HYPERTENSION Disease Monitoring Home BP Monitoring not checking Chest pain- no    Dyspnea- no Medications Compliance-  taking losartan . BMET    Component Value Date/Time   NA 140 05/26/2023 1408   NA 141 09/11/2020 1103   K 4.6 05/26/2023 1408   CL 103 05/26/2023 1408   CO2 30 05/26/2023 1408   GLUCOSE 100 (H) 05/26/2023 1408   BUN 17 05/26/2023 1408   BUN 14 09/11/2020 1103   CREATININE 1.08 05/26/2023 1408   CREATININE 0.95 02/24/2020 1442   CALCIUM 9.8 05/26/2023 1408   GFRNONAA 91 09/11/2020 1103   GFRAA 105 09/11/2020 1103   Prediabetes: Most recent A1c was in the acceptable range.  Patient has been walking 2 times daily except when weather prevents this.  Has been eating healthy with salads, rice and beans, and chicken.  Social History   Tobacco Use  Smoking Status Never  Smokeless Tobacco Never    Current Outpatient Medications on File Prior to Visit  Medication Sig Dispense Refill   b complex vitamins tablet Take 1 tablet by mouth daily.     Elastic Bandages & Supports (MEDICAL COMPRESSION THIGH HIGH) MISC Apply in the morning and remove at bedtime. 2 each 3   losartan  (COZAAR ) 50 MG tablet TAKE 1 TABLET BY MOUTH EVERY DAY 90 tablet 3   Omega-3 Fatty Acids (FISH OIL) 500 MG CAPS Take by mouth.     sodium chloride  (OCEAN) 0.65 % SOLN nasal spray Place 2 sprays into both nostrils as needed for congestion. 30 mL 2   Specialty Vitamins Products (PROSTATE PO) Take by mouth. Force Factor Brand     TURMERIC PO Take 1 tablet by mouth at bedtime.     VITAMIN D, CHOLECALCIFEROL, PO Take 2,000 Units by mouth.     vitamin E 1000 UNIT capsule Take 1,000 Units by mouth daily.     No current facility-administered medications on file prior to visit.     ROS see history of present illness  Objective  Physical Exam Vitals:   11/25/23 1328  BP: 130/74  Pulse: 65   Temp: 97.7 F (36.5 C)  SpO2: 99%    BP Readings from Last 3 Encounters:  11/25/23 130/74  05/13/23 138/86  11/05/22 130/60   Wt Readings from Last 3 Encounters:  11/25/23 161 lb 6.4 oz (73.2 kg)  05/13/23 167 lb 12.8 oz (76.1 kg)  04/09/23 169 lb (76.7 kg)    Physical Exam Constitutional:      General: He is not in acute distress.    Appearance: He is not diaphoretic.  Cardiovascular:     Rate and Rhythm: Normal rate and regular rhythm.     Heart sounds: Normal heart sounds.  Pulmonary:     Effort: Pulmonary effort is normal.     Breath sounds: Normal breath sounds.  Skin:    General: Skin is warm and dry.  Neurological:     Mental Status: He is alert.      Assessment/Plan: Please see individual problem list.  Essential hypertension Assessment & Plan: Chronic issue.  Adequately controlled for his age.  He will continue losartan  50 mg daily.  Check labs.  Continue diet and exercise.  Orders: -     Basic metabolic panel  Prediabetes Assessment & Plan: Chronic issue.  Most recent A1c was in the normal range.  Recheck today.  Encouraged continued healthy  diet and exercise.  Orders: -     Hemoglobin A1c    Return in about 6 months (around 05/24/2024) for Transfer of care.   Camellia Her, MD [facility]

## 2023-11-27 ENCOUNTER — Telehealth: Payer: Self-pay

## 2023-11-27 NOTE — Telephone Encounter (Signed)
 Copied from CRM [id number]. Topic: Clinical - Lab/Test Results >> Nov 27, 2023 12:07 PM Victoria A wrote: Reason for CRM: Patient was calling to get results from lab. Agent read to him what doctor said regarding his A1C. Patient would like a copy of the lab results

## 2023-11-27 NOTE — Telephone Encounter (Signed)
 VM left informing pt to CB in regards to lab results. Results will be posted below **it is okay to read to pt once calls back** :  Please let the patient know his A1c has crept back into the prediabetic range.  He needs to continue healthy diet and exercise.  This can be rechecked at his next visit.  His other labs are acceptable.  Thanks.    (Please send a note back if pt has been informed of labs)

## 2023-11-27 NOTE — Telephone Encounter (Signed)
 Detailed vm left informing pt that copy of labs will be mailed to him.

## 2024-02-02 ENCOUNTER — Telehealth: Payer: Self-pay | Admitting: Family Medicine

## 2024-02-02 NOTE — Telephone Encounter (Signed)
 Copied from CRM [id number]. Topic: Medicare AWV >> Feb 02, 2024 11:18 AM Payton Doughty wrote: Reason for CRM: Called LVM 02/02/2024 to schedule AWV. Please schedule office or virtual visits.  Verlee Rossetti; Care Guide [facility] Direct Dial: [phone]

## 2024-04-17 HISTORY — PX: OTHER SURGICAL HISTORY: SHX169

## 2024-05-03 ENCOUNTER — Ambulatory Visit

## 2024-05-03 ENCOUNTER — Ambulatory Visit (INDEPENDENT_AMBULATORY_CARE_PROVIDER_SITE_OTHER): Admitting: *Deleted

## 2024-05-03 VITALS — Ht 67.0 in | Wt 162.0 lb

## 2024-05-03 DIAGNOSIS — Z0000 Encounter for general adult medical examination without abnormal findings: Secondary | ICD-10-CM

## 2024-05-03 NOTE — Patient Instructions (Signed)
 Henry Skinner , Thank you for taking time out of your busy schedule to complete your Annual Wellness Visit with me. I enjoyed our conversation and look forward to speaking with you again next year. I, as well as your care team,  appreciate your ongoing commitment to your health goals. Please review the following plan we discussed and let me know if I can assist you in the future. Your Game plan/ To Do List    Referrals: If you haven't heard from the office you've been referred to, please reach out to them at the phone provided.  Remember to get your annual flu vaccine Follow up Visits: Next Medicare AWV with our clinical staff: 05/08/25 @ 3:00   Have you seen your provider in the last 6 months (3 months if uncontrolled diabetes)? Yes Next Office Visit with your provider: 05/31/24  Clinician Recommendations:  Aim for 30 minutes of exercise or brisk walking, 6-8 glasses of water, and 5 servings of fruits and vegetables each day.       This is a list of the screening recommended for you and due dates:  Health Maintenance  Topic Date Due   Flu Shot  06/17/2024   COVID-19 Vaccine (10 - Pfizer risk 2024-25 season) 09/14/2024   Medicare Annual Wellness Visit  05/03/2025   DTaP/Tdap/Td vaccine (4 - Td or Tdap) 03/15/2034   Pneumococcal Vaccine for age over 75  Completed   Hepatitis C Screening  Completed   Zoster (Shingles) Vaccine  Completed   HPV Vaccine  Aged Out   Meningitis B Vaccine  Aged Out   Colon Cancer Screening  Discontinued    Advanced directives: (Declined) Advance directive discussed with you today. Even though you declined this today, please call our office should you change your mind, and we can give you the proper paperwork for you to fill out. Will pick up paperwork at next office visit. Advance Care Planning is important because it:  [x]  Makes sure you receive the medical care that is consistent with your values, goals, and preferences  [x]  It provides guidance to your  family and loved ones and reduces their decisional burden about whether or not they are making the right decisions based on your wishes.

## 2024-05-03 NOTE — Progress Notes (Signed)
 Subjective:   Henry Skinner is a 78 y.o. who presents for a Medicare Wellness preventive visit.  As a reminder, Annual Wellness Visits don't include a physical exam, and some assessments may be limited, especially if this visit is performed virtually. We may recommend an in-person follow-up visit with your provider if needed.  Visit Complete: Virtual I connected with  Henry Skinner on 05/03/24 by a audio enabled telemedicine application and verified that I am speaking with the correct person using two identifiers.  Patient Location: Home  Provider Location: Home Office  I discussed the limitations of evaluation and management by telemedicine. The patient expressed understanding and agreed to proceed.  Vital Signs: Because this visit was a virtual/telehealth visit, some criteria may be missing or patient reported. Any vitals not documented were not able to be obtained and vitals that have been documented are patient reported.  VideoDeclined- This patient declined Librarian, academic. Therefore the visit was completed with audio only.  Persons Participating in Visit: Patient.  AWV Questionnaire: No: Patient Medicare AWV questionnaire was not completed prior to this visit.  Cardiac Risk Factors include: advanced age (>25men, >81 women);male gender;hypertension     Objective:    Today's Vitals   05/03/24 1419  Weight: 162 lb (73.5 kg)  Height: 5' 7 (1.702 m)   Body mass index is 25.37 kg/m.     05/03/2024    2:38 PM 04/09/2023   12:38 PM 04/01/2022    3:10 PM 03/26/2021    1:30 PM 03/23/2020    1:39 PM 02/01/2020    1:48 PM 08/30/2019    8:32 AM  Advanced Directives  Does Patient Have a Medical Advance Directive? No No Yes No No No Yes  Type of Surveyor, minerals;Living will    Living will  Does patient want to make changes to medical advance directive?   No - Patient declined      Copy of Healthcare Power of Attorney  in Chart?   No - copy requested      Would patient like information on creating a medical advance directive? No - Patient declined Yes (MAU/Ambulatory/Procedural Areas - Information given)  No - Patient declined Yes (MAU/Ambulatory/Procedural Areas - Information given)      Current Medications (verified) Outpatient Encounter Medications as of 05/03/2024  Medication Sig   b complex vitamins tablet Take 1 tablet by mouth daily.   Coenzyme Q10 (COQ-10 PO) Take by mouth daily.   Elastic Bandages & Supports (MEDICAL COMPRESSION THIGH HIGH) MISC Apply in the morning and remove at bedtime.   GARLIC PO Take by mouth daily.   losartan  (COZAAR ) 50 MG tablet TAKE 1 TABLET BY MOUTH EVERY DAY   Omega-3 Fatty Acids (FISH OIL) 500 MG CAPS Take by mouth.   Specialty Vitamins Products (PROSTATE PO) Take by mouth. Force Factor Brand   TURMERIC PO Take 1 tablet by mouth at bedtime.   VITAMIN D, CHOLECALCIFEROL, PO Take 2,000 Units by mouth.   vitamin E 1000 UNIT capsule Take 1,000 Units by mouth daily.   sodium chloride  (OCEAN) 0.65 % SOLN nasal spray Place 2 sprays into both nostrils as needed for congestion. (Patient not taking: Reported on 05/03/2024)   No facility-administered encounter medications on file as of 05/03/2024.    Allergies (verified) Patient has no known allergies.   History: Past Medical History:  Diagnosis Date   Chickenpox    Hypertension    Kidney stones  Skin irritation    Past Surgical History:  Procedure Laterality Date   COLONOSCOPY WITH PROPOFOL  N/A 08/30/2019   Procedure: COLONOSCOPY WITH PROPOFOL ;  Surgeon: Luke Salaam, MD;  Location: [facility] ENDOSCOPY;  Service: Gastroenterology;  Laterality: N/A;   lesion removed from arm Left 04/2024   PAROTIDECTOMY Left 03/04/2016   Procedure: PAROTIDECTOMY;  Surgeon: Lesly Raspberry, MD;  Location: [facility] ORS;  Service: ENT;  Laterality: Left;   ROTATOR CUFF REPAIR  03/2015   SKIN SURGERY     L hand, L arm/shoulder    Family  History  Problem Relation Age of Onset   Alcoholism Brother    Colon cancer Maternal Uncle    Social History   Socioeconomic History   Marital status: Single    Spouse name: Not on file   Number of children: Not on file   Years of education: Not on file   Highest education level: Not on file  Occupational History   Not on file  Tobacco Use   Smoking status: Never   Smokeless tobacco: Never  Vaping Use   Vaping status: Never Used  Substance and Sexual Activity   Alcohol use: Yes    Alcohol/week: 1.0 standard drink of alcohol    Types: 1 Standard drinks or equivalent per week    Comment: 1 beer daily   Drug use: Yes    Types: Marijuana    Comment: marijuana, rarely. Last use 11/2018   Sexual activity: Yes  Other Topics Concern   Not on file  Social History Narrative   Not on file   Social Drivers of Health   Financial Resource Strain: Low Risk  (05/03/2024)   Overall Financial Resource Strain (CARDIA)    Difficulty of Paying Living Expenses: Not hard at all  Food Insecurity: No Food Insecurity (05/03/2024)   Hunger Vital Sign    Worried About Running Out of Food in the Last Year: Never true    Ran Out of Food in the Last Year: Never true  Transportation Needs: No Transportation Needs (05/03/2024)   PRAPARE - Administrator, Civil Service (Medical): No    Lack of Transportation (Non-Medical): No  Physical Activity: Sufficiently Active (05/03/2024)   Exercise Vital Sign    Days of Exercise per Week: 4 days    Minutes of Exercise per Session: 40 min  Stress: No Stress Concern Present (05/03/2024)   [employer] of Occupational Health - Occupational Stress Questionnaire    Feeling of Stress: Not at all  Social Connections: Socially Isolated (05/03/2024)   Social Connection and Isolation Panel    Frequency of Communication with Friends and Family: More than three times a week    Frequency of Social Gatherings with Friends and Family: Twice a week    Attends  Religious Services: Never    Database administrator or Organizations: No    Attends Engineer, structural: Never    Marital Status: Divorced    Tobacco Counseling Counseling given: Not Answered    Clinical Intake:  Pre-visit preparation completed: Yes  Pain : No/denies pain     BMI - recorded: 25.37 Nutritional Status: BMI 25 -29 Overweight Nutritional Risks: None Diabetes: No  Lab Results  Component Value Date   HGBA1C 5.8 11/25/2023   HGBA1C 5.5 05/13/2023   HGBA1C 5.7 11/05/2022     How often do you need to have someone help you when you read instructions, pamphlets, or other written materials from your doctor or pharmacy?: 1 -  Never  Interpreter Needed?: No  Information entered by :: R. Sherrian Nunnelley LPN   Activities of Daily Living     05/03/2024    2:21 PM  In your present state of health, do you have any difficulty performing the following activities:  Hearing? 0  Vision? 0  Comment no correction  Difficulty concentrating or making decisions? 0  Walking or climbing stairs? 0  Dressing or bathing? 0  Doing errands, shopping? 0  Preparing Food and eating ? N  Using the Toilet? N  In the past six months, have you accidently leaked urine? N  Do you have problems with loss of bowel control? N  Managing your Medications? N  Managing your Finances? N  Housekeeping or managing your Housekeeping? N    No care team member to display  I have updated your Care Teams any recent Medical Services you may have received from other providers in the past year.     Assessment:   This is a routine wellness examination for Arrin.  Hearing/Vision screen Hearing Screening - Comments:: No issues Vision Screening - Comments:: No correction   Goals Addressed             This Visit's Progress    Patient Stated       Wants to try to walk more       Depression Screen     05/03/2024    2:32 PM 05/13/2023    1:43 PM 04/09/2023   12:36 PM 11/05/2022    2:30  PM 06/04/2022    1:23 PM 04/01/2022    3:08 PM 11/05/2021   10:37 AM  PHQ 2/9 Scores  PHQ - 2 Score 0 0 0 0 0 0 0  PHQ- 9 Score 1 2  0       Fall Risk     05/03/2024    2:24 PM 05/13/2023    1:43 PM 04/09/2023   12:48 PM 11/05/2022    2:30 PM 06/04/2022    1:23 PM  Fall Risk   Falls in the past year? 0 0 0 0 0  Number falls in past yr: 0 0 0 0   Injury with Fall? 0 0 0 0   Risk for fall due to : No Fall Risks No Fall Risks No Fall Risks No Fall Risks   Follow up Falls evaluation completed;Falls prevention discussed Falls evaluation completed Falls prevention discussed;Education provided;Falls evaluation completed Falls evaluation completed       Data saved with a previous flowsheet row definition    MEDICARE RISK AT HOME:  Medicare Risk at Home Any stairs in or around the home?: Yes If so, are there any without handrails?: Yes Home free of loose throw rugs in walkways, pet beds, electrical cords, etc?: Yes Adequate lighting in your home to reduce risk of falls?: Yes Life alert?: No Use of a cane, walker or w/c?: No Grab bars in the bathroom?: Yes Shower chair or bench in shower?: Yes Elevated toilet seat or a handicapped toilet?: No  TIMED UP AND GO:  Was the test performed?  No  Cognitive Function: 6CIT completed    03/19/2018    2:45 PM 03/18/2017    2:47 PM  MMSE - Mini Mental State Exam  Orientation to time 5 5   Orientation to Place 5 5   Registration 3 3   Attention/ Calculation 5 5   Recall 3 3   Language- name 2 objects 2 2   Language- repeat 1  1  Language- follow 3 step command 3 3   Language- read & follow direction 1 1   Write a sentence 1 1   Copy design 1 1   Total score 30 30      Data saved with a previous flowsheet row definition        05/03/2024    2:40 PM 04/09/2023   12:41 PM 04/01/2022    3:14 PM 03/26/2021    1:35 PM 03/23/2020    1:56 PM  6CIT Screen  What Year? 0 points 0 points 0 points 0 points 0 points  What month? 0 points 0 points  0 points 0 points 0 points  What time? 0 points 0 points 0 points 0 points 0 points  Count back from 20 0 points 0 points 0 points 0 points 0 points  Months in reverse 0 points 0 points 0 points 0 points 0 points  Repeat phrase 0 points 0 points 0 points 0 points 0 points  Total Score 0 points 0 points 0 points 0 points 0 points    Immunizations Immunization History  Administered Date(s) Administered   Fluad Quad(high Dose 65+) 07/17/2022   Influenza, High Dose Seasonal PF 11/09/2015, 09/10/2016, 08/19/2017, 08/09/2018, 07/05/2019, 07/05/2019, 07/31/2020, 06/26/2021   Influenza,inj,quad, With Preservative 08/22/2014   Influenza-Unspecified 10/20/2016, 07/28/2023   Moderna Covid-19 Vaccine Bivalent Booster 76yrs & up 07/28/2023   PFIZER Comirnaty(Gray Top)Covid-19 Tri-Sucrose Vaccine 03/15/2021   PFIZER(Purple Top)SARS-COV-2 Vaccination 12/27/2019, 01/17/2020, 08/14/2020   PNEUMOCOCCAL CONJUGATE-20 06/26/2021   Pfizer Covid-19 Vaccine Bivalent Booster 23yrs & up 09/06/2021   Pfizer(Comirnaty)Fall Seasonal Vaccine 12 years and older 08/23/2022, 01/27/2023   Pneumococcal Conjugate-13 12/25/2016   Pneumococcal Polysaccharide-23 03/19/2018   Td 03/12/2021   Tdap 01/08/2016   Zoster Recombinant(Shingrix) 06/08/2019, 09/23/2019   Zoster, Live 12/18/2006    Screening Tests Health Maintenance  Topic Date Due   COVID-19 Vaccine (9 - 2024-25 season) 09/22/2023   Medicare Annual Wellness (AWV)  04/08/2024   INFLUENZA VACCINE  06/17/2024   DTaP/Tdap/Td (3 - Td or Tdap) 03/13/2031   Pneumococcal Vaccine: 50+ Years  Completed   Hepatitis C Screening  Completed   Zoster Vaccines- Shingrix  Completed   HPV VACCINES  Aged Out   Meningococcal B Vaccine  Aged Out   Colonoscopy  Discontinued    Health Maintenance  Health Maintenance Due  Topic Date Due   COVID-19 Vaccine (9 - 2024-25 season) 09/22/2023   Medicare Annual Wellness (AWV)  04/08/2024   Health Maintenance Items  Addressed: Dicussed the need to continue to get an annual flu vaccine.  Additional Screening:  Vision Screening: Recommended annual ophthalmology exams for early detection of glaucoma and other disorders of the eye. Up to date [facility] Would you like a referral to an eye doctor? No    Dental Screening: Recommended annual dental exams for proper oral hygiene  Community Resource Referral / Chronic Care Management: CRR required this visit?  No   CCM required this visit?  No   Plan:    I have personally reviewed and noted the following in the patient's chart:   Medical and social history Use of alcohol, tobacco or illicit drugs  Current medications and supplements including opioid prescriptions. Patient is not currently taking opioid prescriptions. Functional ability and status Nutritional status Physical activity Advanced directives List of other physicians Hospitalizations, surgeries, and ER visits in previous 12 months Vitals Screenings to include cognitive, depression, and falls Referrals and appointments  In addition, I have reviewed  and discussed with patient certain preventive protocols, quality metrics, and best practice recommendations. A written personalized care plan for preventive services as well as general preventive health recommendations were provided to patient.   Felicitas Horse, LPN   1/61/0960   After Visit Summary: (Pick Up) Due to this being a telephonic visit, with patients personalized plan was offered to patient and patient has requested to Pick up at office.  Notes: Nothing significant to report at this time.

## 2024-05-31 ENCOUNTER — Encounter: Payer: Self-pay | Admitting: Nurse Practitioner

## 2024-05-31 ENCOUNTER — Ambulatory Visit (INDEPENDENT_AMBULATORY_CARE_PROVIDER_SITE_OTHER): Payer: PPO | Admitting: Nurse Practitioner

## 2024-05-31 VITALS — BP 120/70 | HR 62 | Temp 98.1°F | Ht 67.0 in | Wt 154.8 lb

## 2024-05-31 DIAGNOSIS — I10 Essential (primary) hypertension: Secondary | ICD-10-CM

## 2024-05-31 DIAGNOSIS — R7303 Prediabetes: Secondary | ICD-10-CM | POA: Diagnosis not present

## 2024-05-31 DIAGNOSIS — Z13220 Encounter for screening for lipoid disorders: Secondary | ICD-10-CM

## 2024-05-31 DIAGNOSIS — G479 Sleep disorder, unspecified: Secondary | ICD-10-CM | POA: Diagnosis not present

## 2024-05-31 MED ORDER — LOSARTAN POTASSIUM 50 MG PO TABS
50.0000 mg | ORAL_TABLET | Freq: Every day | ORAL | 3 refills | Status: AC
Start: 2024-05-31 — End: ?

## 2024-05-31 NOTE — Progress Notes (Signed)
 Leron Glance, NP-C Phone: [phone]  Henry Skinner is a 78 y.o. male who presents today for transfer of care.   Discussed the use of AI scribe software for clinical note transcription with the patient, who gave verbal consent to proceed.  History of Present Illness   Henry Skinner is a 78 year old male who presents for a transfer of care.   He has been experiencing trouble sleeping, which he attributes to his living conditions in an old house with a fan blowing on his head and chest. To aid his sleep, he has been using cannabis gummies for the past two weeks, which he states work effectively and help him achieve deep sleep. He works from 5 PM to midnight twice a week, getting home at 1:30 AM and sleeping by 2:30 or 3 AM. He feels more rested since using the gummies.  He is currently taking losartan  for blood pressure and reports his blood pressure is 120/70 mmHg. He does not monitor his blood pressure at home. No chest pain, shortness of breath, dizziness, or leg swelling. He wears compression stockings to prevent swelling, as he had 'fat ankles' growing up.  He has a history of prediabetes, which he manages with diet and exercise. He has lost weight, dropping from 172 lbs to 155 lbs. His diet consists mainly of grains, vegetables, chicken, and fish, with occasional consumption of meatless alternatives. He works at [employer] and has access to a variety of healthy food options. He stays active by walking and mowing the lawn, which takes about six hours. He drinks plenty of fluids, including ionized water, and has increased his intake to two gallons per work shift, with only one cup of coffee.  He does not have a television and prefers to receive communication via mail due to issues with his cell phone.      Social History   Tobacco Use  Smoking Status Never  Smokeless Tobacco Never    Current Outpatient Medications on File Prior to Visit  Medication Sig Dispense Refill    b complex vitamins tablet Take 1 tablet by mouth daily.     Coenzyme Q10 (COQ-10 PO) Take by mouth daily.     Elastic Bandages & Supports (MEDICAL COMPRESSION THIGH HIGH) MISC Apply in the morning and remove at bedtime. 2 each 3   GARLIC PO Take by mouth daily.     Omega-3 Fatty Acids (FISH OIL) 500 MG CAPS Take by mouth.     Specialty Vitamins Products (PROSTATE PO) Take by mouth. Force Factor Brand     TURMERIC PO Take 1 tablet by mouth at bedtime.     VITAMIN D, CHOLECALCIFEROL, PO Take 2,000 Units by mouth.     vitamin E 1000 UNIT capsule Take 1,000 Units by mouth daily.     No current facility-administered medications on file prior to visit.     ROS see history of present illness  Objective  Physical Exam Vitals:   05/31/24 1344  BP: 120/70  Pulse: 62  Temp: 98.1 F (36.7 C)  SpO2: 97%    BP Readings from Last 3 Encounters:  05/31/24 120/70  11/25/23 130/74  05/13/23 138/86   Wt Readings from Last 3 Encounters:  05/31/24 154 lb 12.8 oz (70.2 kg)  05/03/24 162 lb (73.5 kg)  11/25/23 161 lb 6.4 oz (73.2 kg)    Physical Exam Constitutional:      General: He is not in acute distress.    Appearance: Normal appearance.  HENT:     Head: Normocephalic.  Cardiovascular:     Rate and Rhythm: Normal rate and regular rhythm.     Heart sounds: Normal heart sounds.  Pulmonary:     Effort: Pulmonary effort is normal.     Breath sounds: Normal breath sounds.  Skin:    General: Skin is warm and dry.  Neurological:     General: No focal deficit present.     Mental Status: He is alert.  Psychiatric:        Mood and Affect: Mood normal.        Behavior: Behavior normal.      Assessment/Plan: Please see individual problem list.  Essential hypertension Assessment & Plan: Blood pressure is well-controlled at 120/70 mmHg with losartan . Continue losartan  50 mg daily. Check CMP.   Orders: -     Comprehensive metabolic panel with GFR -     Losartan  Potassium; Take 1  tablet (50 mg total) by mouth daily.  Dispense: 90 tablet; Refill: 3  Prediabetes Assessment & Plan: Managed through diet and exercise. Recent weight loss. Continue the current diet and exercise regimen. Check A1c.  Orders: -     Hemoglobin A1c  Sleeping difficulty Assessment & Plan: Recent onset. Cannabis gummies effectively aid his sleep. Counseling provided. Discussed sleep hygiene. Advised melatonin at sundown. We will continue to monitor.    Lipid screening -     Lipid panel    Return in about 6 months (around 12/01/2024) for Follow up.   Leron Glance, NP-C  Primary Care - [facility]

## 2024-06-01 LAB — COMPREHENSIVE METABOLIC PANEL WITH GFR
ALT: 17 U/L (ref 0–53)
AST: 20 U/L (ref 0–37)
Albumin: 4.3 g/dL (ref 3.5–5.2)
Alkaline Phosphatase: 69 U/L (ref 39–117)
BUN: 18 mg/dL (ref 6–23)
CO2: 29 meq/L (ref 19–32)
Calcium: 9.5 mg/dL (ref 8.4–10.5)
Chloride: 103 meq/L (ref 96–112)
Creatinine, Ser: 0.99 mg/dL (ref 0.40–1.50)
GFR: 74.21 mL/min (ref >60.00)
Glucose, Bld: 120 mg/dL — ABNORMAL HIGH (ref 70–99)
Potassium: 3.7 meq/L (ref 3.5–5.1)
Sodium: 140 meq/L (ref 135–145)
Total Bilirubin: 1.4 mg/dL — ABNORMAL HIGH (ref 0.2–1.2)
Total Protein: 6.7 g/dL (ref 6.0–8.3)

## 2024-06-01 LAB — HEMOGLOBIN A1C: Hgb A1c MFr Bld: 5.6 % (ref 4.6–6.5)

## 2024-06-01 LAB — LIPID PANEL
Cholesterol: 137 mg/dL (ref 0–200)
HDL: 52.2 mg/dL (ref >39.00)
LDL Cholesterol: 77 mg/dL (ref 0–99)
NonHDL: 84.43
Total CHOL/HDL Ratio: 3
Triglycerides: 38 mg/dL (ref 0.0–149.0)
VLDL: 7.6 mg/dL (ref 0.0–40.0)

## 2024-06-02 ENCOUNTER — Ambulatory Visit: Payer: Self-pay | Admitting: Nurse Practitioner

## 2024-06-06 ENCOUNTER — Encounter: Payer: Self-pay | Admitting: Nurse Practitioner

## 2024-06-06 DIAGNOSIS — G479 Sleep disorder, unspecified: Secondary | ICD-10-CM | POA: Insufficient documentation

## 2024-06-06 NOTE — Assessment & Plan Note (Signed)
 Managed through diet and exercise. Recent weight loss. Continue the current diet and exercise regimen. Check A1c.

## 2024-06-06 NOTE — Assessment & Plan Note (Signed)
 Blood pressure is well-controlled at 120/70 mmHg with losartan . Continue losartan  50 mg daily. Check CMP.

## 2024-06-06 NOTE — Assessment & Plan Note (Signed)
 Recent onset. Cannabis gummies effectively aid his sleep. Counseling provided. Discussed sleep hygiene. Advised melatonin at sundown. We will continue to monitor.

## 2024-08-11 ENCOUNTER — Ambulatory Visit (INDEPENDENT_AMBULATORY_CARE_PROVIDER_SITE_OTHER): Admitting: Nurse Practitioner

## 2024-08-11 ENCOUNTER — Encounter: Payer: Self-pay | Admitting: Nurse Practitioner

## 2024-08-11 VITALS — BP 130/72 | HR 67 | Temp 97.9°F | Ht 67.0 in | Wt 154.8 lb

## 2024-08-11 DIAGNOSIS — M5432 Sciatica, left side: Secondary | ICD-10-CM

## 2024-08-11 MED ORDER — METHYLPREDNISOLONE 4 MG PO TBPK: ORAL_TABLET | ORAL | 0 refills | Status: DC

## 2024-08-11 NOTE — Progress Notes (Signed)
 Leron Glance, NP-C Phone: [phone]  Henry Skinner is a 78 y.o. male who presents today for back pain.   Discussed the use of AI scribe software for clinical note transcription with the patient, who gave verbal consent to proceed.  History of Present Illness   Henry Skinner is a 78 year old male who presents with left gluteal pain.  He has been experiencing left gluteal pain for the past four to five weeks. The pain is localized to the gluteal region and does not radiate down his leg, unlike previous episodes of sciatica he has had twice in the past six years. He describes the sensation as feeling like a 'ball' moving from high to low in the gluteal region.  He uses a cream marketed for sciatica, which provides relief but does not completely resolve the pain. The cream is applied daily and gives a sensation of warmth without actual heat. He also massages the area frequently, which provides temporary relief. The pain worsens with prolonged walking and sitting on non-firm surfaces. He has adjusted his sitting posture by using towels for support. Physical activities such as cutting wood and mopping have been modified to manage the pain, but these activities still exacerbate his symptoms if done for extended periods.  No bowel or bladder issues and no weakness in his legs, although he walks less due to the pain. His sleep has been affected recently, with less deep and sound sleep over the past few nights. He has a history of regular walking since 2004, typically four to five days a week, covering distances up to a mile and a half. He works on a Hospital doctor. He attributes the onset of his current symptoms to a change in his mopping technique, using his left side more frequently.      Social History   Tobacco Use  Smoking Status Never  Smokeless Tobacco Never    Current Outpatient Medications on File Prior to Visit  Medication Sig Dispense Refill   b complex vitamins tablet Take 1  tablet by mouth daily.     Coenzyme Q10 (COQ-10 PO) Take by mouth daily.     Elastic Bandages & Supports (MEDICAL COMPRESSION THIGH HIGH) MISC Apply in the morning and remove at bedtime. 2 each 3   GARLIC PO Take by mouth daily.     losartan  (COZAAR ) 50 MG tablet Take 1 tablet (50 mg total) by mouth daily. 90 tablet 3   Omega-3 Fatty Acids (FISH OIL) 500 MG CAPS Take by mouth.     Specialty Vitamins Products (PROSTATE PO) Take by mouth. Force Factor Brand     TURMERIC PO Take 1 tablet by mouth at bedtime.     vitamin E 1000 UNIT capsule Take 1,000 Units by mouth daily.     No current facility-administered medications on file prior to visit.     ROS see history of present illness  Objective  Physical Exam Vitals:   08/11/24 1024  BP: 130/72  Pulse: 67  Temp: 97.9 F (36.6 C)  SpO2: 99%    BP Readings from Last 3 Encounters:  08/17/24 128/78  08/11/24 130/72  05/31/24 120/70   Wt Readings from Last 3 Encounters:  08/17/24 156 lb (70.8 kg)  08/11/24 154 lb 12.8 oz (70.2 kg)  05/31/24 154 lb 12.8 oz (70.2 kg)    Physical Exam Constitutional:      General: He is not in acute distress.    Appearance: Normal appearance.  HENT:  Head: Normocephalic.  Cardiovascular:     Rate and Rhythm: Normal rate and regular rhythm.     Heart sounds: Normal heart sounds.  Pulmonary:     Effort: Pulmonary effort is normal.     Breath sounds: Normal breath sounds.  Musculoskeletal:     Left hip: Tenderness present. Normal range of motion. Normal strength.       Legs:  Skin:    General: Skin is warm and dry.  Neurological:     General: No focal deficit present.     Mental Status: He is alert.  Psychiatric:        Mood and Affect: Mood normal.        Behavior: Behavior normal.      Assessment/Plan: Please see individual problem list.  Sciatica of left side Assessment & Plan: Chronic left gluteal myalgia has persisted for 4-5 weeks, localized without radiation, likely  due to repetitive strain from work activities. Pain worsens with prolonged walking and sitting, with partial relief from topical cream, stretching, and massage. Start MDP. Consider a muscle relaxer if needed. Continue using topical cream as needed. Alternate heat and ice therapy. Encourage stretching and massage therapy. Discussed potential steroid side effects. Monitor and report significant issues. Return to care if symptoms persist or worsen.   Orders: -     methylPREDNISolone ; Take as directed.  Dispense: 21 each; Refill: 0      Return if symptoms worsen or fail to improve.   Leron Glance, NP-C [place] [facility]

## 2024-08-17 ENCOUNTER — Ambulatory Visit (INDEPENDENT_AMBULATORY_CARE_PROVIDER_SITE_OTHER): Admitting: Family

## 2024-08-17 ENCOUNTER — Encounter: Payer: Self-pay | Admitting: Family

## 2024-08-17 ENCOUNTER — Ambulatory Visit

## 2024-08-17 VITALS — BP 128/78 | HR 76 | Temp 97.7°F | Ht 67.0 in | Wt 156.0 lb

## 2024-08-17 DIAGNOSIS — M5432 Sciatica, left side: Secondary | ICD-10-CM

## 2024-08-17 NOTE — Assessment & Plan Note (Signed)
 Episodic. Duration 6 weeks. D/D lumbar stenosis, hip arthritis, SI joint dysfunction. Pending XR, referral to PT.

## 2024-08-17 NOTE — Progress Notes (Signed)
 Assessment & Plan:  Sciatica of left side Assessment & Plan: Episodic. Duration 6 weeks. D/D lumbar stenosis, hip arthritis, SI joint dysfunction. Pending XR, referral to PT.  Orders: -     DG Lumbar Spine Complete; Future -     DG HIP UNILAT W OR W/O PELVIS 2-3 VIEWS LEFT; Future -     Ambulatory referral to Physical Therapy     Return precautions given.   Risks, benefits, and alternatives of the medications and treatment plan prescribed today were discussed, and patient expressed understanding.   Education regarding symptom management and diagnosis given to patient on AVS either electronically or printed.  Return in about 6 weeks (around 09/28/2024).  Rollene Northern, FNP  Subjective:    Patient ID: Henry Skinner, male    DOB: 1946/01/04, 78 y.o.   MRN: [id number]  CC: Henry Skinner is a 78 y.o. male who presents today for an acute visit.    HPI: Left gluteal pain and left inner groin x 6 weeks ago , waxes and wanes  Worse with mopping. Stiffness with getting up in the morning. Pain improves with walking and stretching.  He can sleep on left hip Describes as ache , throbbing.   He has had some improvement after he has been massaging the area  Denies falls, trauma, numbness, urinary incontinence, fecal incontinence, saddle anesthesia  He has similar episodes in which pain was in left low back and radiated to left ankle.  He has been using magnlife topical with some relif.   He works a custodian  No h/o CKD  H/o HTN, skin cancer, prediabetes Seen by Leron Glance 08/11/24 for left sided sciatica Started on medrol  dose pack  Non smoker    Allergies: Patient has no known allergies. Current Outpatient Medications on File Prior to Visit  Medication Sig Dispense Refill   b complex vitamins tablet Take 1 tablet by mouth daily.     Coenzyme Q10 (COQ-10 PO) Take by mouth daily.     Elastic Bandages & Supports (MEDICAL COMPRESSION THIGH HIGH) MISC Apply in the morning  and remove at bedtime. 2 each 3   GARLIC PO Take by mouth daily.     losartan  (COZAAR ) 50 MG tablet Take 1 tablet (50 mg total) by mouth daily. 90 tablet 3   methylPREDNISolone  (MEDROL  DOSEPAK) 4 MG TBPK tablet Take as directed. 21 each 0   Omega-3 Fatty Acids (FISH OIL) 500 MG CAPS Take by mouth.     Specialty Vitamins Products (PROSTATE PO) Take by mouth. Force Factor Brand     TURMERIC PO Take 1 tablet by mouth at bedtime.     vitamin E 1000 UNIT capsule Take 1,000 Units by mouth daily.     No current facility-administered medications on file prior to visit.    Review of Systems  Constitutional:  Negative for chills and fever.  Respiratory:  Negative for cough.   Cardiovascular:  Negative for chest pain and palpitations.  Gastrointestinal:  Negative for abdominal distention, constipation, nausea and vomiting.  Genitourinary:  Negative for difficulty urinating.  Musculoskeletal:  Positive for back pain.  Neurological:  Negative for numbness.      Objective:    BP 128/78   Pulse 76   Temp 97.7 F (36.5 C) (Oral)   Ht 5' 7 (1.702 m)   Wt 156 lb (70.8 kg)   SpO2 96%   BMI 24.43 kg/m   BP Readings from Last 3 Encounters:  08/17/24 128/78  08/11/24 130/72  05/31/24 120/70   Wt Readings from Last 3 Encounters:  08/17/24 156 lb (70.8 kg)  08/11/24 154 lb 12.8 oz (70.2 kg)  05/31/24 154 lb 12.8 oz (70.2 kg)    Physical Exam Vitals reviewed.  Constitutional:      Appearance: He is well-developed.  Cardiovascular:     Rate and Rhythm: Regular rhythm.     Heart sounds: Normal heart sounds.  Pulmonary:     Effort: Pulmonary effort is normal. No respiratory distress.     Breath sounds: Normal breath sounds. No wheezing or rales.  Musculoskeletal:     Lumbar back: No swelling, spasms or tenderness. Normal range of motion. Negative right straight leg raise test and negative left straight leg raise test.     Comments: Full range of motion with flexion, extension, lateral  side bends. No pain, numbness, tingling elicited with single leg raise bilaterally. No rash. Bilateral Hip: No limp or waddling gait. Full ROM with flexion and hip rotation in flexion.    No pain of lateral hip with  (flexion-abduction-external rotation) test.   No pain with deep palpation of greater trochanter.     Skin:    General: Skin is warm and dry.  Neurological:     Mental Status: He is alert.  Psychiatric:        Speech: Speech normal.        Behavior: Behavior normal.

## 2024-08-17 NOTE — Patient Instructions (Signed)
 Referral for physical therapy.   Let us  know if you dont hear back within 2 weeks in regards to an appointment being scheduled.   Xrays today  Let me know if pain were to change , worsen, or new symptoms develop.   Nice to meet you!

## 2024-08-19 ENCOUNTER — Encounter: Payer: Self-pay | Admitting: Nurse Practitioner

## 2024-08-19 NOTE — Assessment & Plan Note (Signed)
 Chronic left gluteal myalgia has persisted for 4-5 weeks, localized without radiation, likely due to repetitive strain from work activities. Pain worsens with prolonged walking and sitting, with partial relief from topical cream, stretching, and massage. Start MDP. Consider a muscle relaxer if needed. Continue using topical cream as needed. Alternate heat and ice therapy. Encourage stretching and massage therapy. Discussed potential steroid side effects. Monitor and report significant issues. Return to care if symptoms persist or worsen.

## 2024-08-25 ENCOUNTER — Ambulatory Visit: Payer: Self-pay

## 2024-08-25 ENCOUNTER — Ambulatory Visit: Payer: Self-pay | Admitting: Family

## 2024-08-25 NOTE — Telephone Encounter (Signed)
 FYI Only or Action Required?: Action required by provider: lab or test result follow-up needed.  Patient was last seen in primary care on 08/17/2024 by Dineen Rollene MATSU, FNP.  Called Nurse Triage reporting Results.  Symptoms began N/A.  Interventions attempted: Other: n/a.  Symptoms are: n/a.  Triage Disposition: Call PCP Within 24 Hours  Patient/caregiver understands and will follow disposition?: No, wishes to speak with PCP  Copied from CRM #[id number]. Topic: Clinical - Lab/Test Results >> Aug 25, 2024  4:36 PM Rea ORN wrote: Reason for CRM: Pt returned call for xray results. Pt given results but is asking for a nurse to give an explanation of results. Reason for Disposition  [1] Caller requests to speak ONLY to PCP AND [2] NON-URGENT question  Answer Assessment - Initial Assessment Questions 1. REASON FOR CALL or QUESTION: What is your reason for calling today? or How can I best     1) Review imaging results. He has further questions for PCP.   2) Physical therapy will start on Monday, they were not able to see him on Friday.  3) Patient is requesting follow up call from pcp tomorrow, 08/26/24, to discuss imaging results, he has specific questions.  Protocols used: PCP Call - No Triage-A-AH

## 2024-08-26 NOTE — Telephone Encounter (Signed)
Attempted to call pt, no answer. LMTCB

## 2024-08-29 ENCOUNTER — Ambulatory Visit

## 2024-08-29 NOTE — Telephone Encounter (Signed)
 Copied from CRM [id number]. Topic: Clinical - Lab/Test Results >> Aug 25, 2024  4:36 PM Rea ORN wrote: Reason for CRM: Pt returned call for xray results. Pt given results but is asking for a nurse to give an explanation of results. >> Aug 26, 2024  5:58 PM Drema MATSU wrote: Patient returned call for imaging results. Please call patient. He is going into physical therapy at 1:45pm on Monday so please before 1:45 so he can answer.

## 2024-08-29 NOTE — Telephone Encounter (Signed)
 Called pt was unable to lvm due to vm being full

## 2024-08-30 ENCOUNTER — Telehealth: Payer: Self-pay | Admitting: Nurse Practitioner

## 2024-08-30 NOTE — Telephone Encounter (Signed)
 Pt stopped back in saying he's been playing phone tag with the office. I let him know that the last encounter stated his VM is full, to please check it.  He says his pain has lessened, but was calling to report the change. He was just looking to touch base.

## 2024-08-31 NOTE — Therapy (Signed)
 OUTPATIENT PHYSICAL THERAPY THORACOLUMBAR EVALUATION   Patient Name: Henry Skinner MRN: [id number] DOB:16-Apr-1946, 78 y.o., male Today's Date: 09/01/2024  END OF SESSION:  PT End of Session - 09/01/24 1257     Visit Number 1    Number of Visits 17    Date for Recertification  10/27/24    PT Start Time 1300    PT Stop Time 1341    PT Time Calculation (min) 41 min    Activity Tolerance Patient tolerated treatment well    Behavior During Therapy [facility] for tasks assessed/performed          Past Medical History:  Diagnosis Date   Chickenpox    Hypertension    Kidney stones    Skin irritation    Past Surgical History:  Procedure Laterality Date   COLONOSCOPY WITH PROPOFOL  N/A 08/30/2019   Procedure: COLONOSCOPY WITH PROPOFOL ;  Surgeon: Therisa Bi, MD;  Location: [facility] END[facility]COPY;  Service: Gastroenterology;  Laterality: N/A;   lesion removed from arm Left 04/2024   PAROTIDECTOMY Left 03/04/2016   Procedure: PAROTIDECTOMY;  Surgeon: Chinita Hasten, MD;  Location: [facility] ORS;  Service: ENT;  Laterality: Left;   ROTATOR CUFF REPAIR  03/2015   SKIN SURGERY     L hand, L arm/shoulder    Patient Active Problem List   Diagnosis Date Noted   Sciatica of left side 08/11/2024   Sleeping difficulty 06/06/2024   Encounter for general adult medical examination with abnormal findings 11/05/2022   Muscle cramps 11/05/2022   Shoulder joint pain 05/06/2022   Rib pain 05/06/2022   Leg swelling 10/08/2020   Skin lesion 08/24/2019   Prediabetes 03/23/2019   BPH (benign prostatic hyperplasia) 02/17/2018   Family history of colon cancer 02/17/2018   Essential hypertension 01/06/2017   Skin cancer 01/06/2017   Actinic keratoses 07/03/2016   Parotid mass 02/01/2016   Overweight 01/08/2016   History of kidney stones 01/08/2016    PCP: Gretel App, NP  REFERRING PROVIDER: Dineen Rollene MATSU, FNP  REFERRING DIAG: [phone] (ICD-10-CM) - Sciatica of left side  Rationale for Evaluation  and Treatment: Rehabilitation  THERAPY DIAG:  Muscle weakness (generalized)  Sciatica, left side  ONSET DATE: 8 weeks ago   SUBJECTIVE:                                                                                                                                                                                           SUBJECTIVE STATEMENT: Since 2004, he has been going to hike the [facility]. He now walks the side of the road 3-4 times/week. He works as a Copy and frequently mopping. In the  past 6 years, he has had 2 cases of sciatica where the pain goes down the L leg. He has been stretching to help. This current episode has been going on for 10 weeks. The pain currently does not go past the L glute. MD started him on steroid which didn't help. He then received a referral to PT.   PERTINENT HISTORY:  Per MD note on 08/17/2024, Left gluteal pain and left inner groin x 6 weeks ago , waxes and wanes  Worse with mopping. Stiffness with getting up in the morning. Pain improves with walking and stretching.  He can sleep on left hip Describes as ache , throbbing.  PAIN:  Are you having pain? Yes: NPRS scale: 1/10 current; 8-9/10 Pain location: L glute  Pain description: cramping Aggravating factors: sitting unsupported, at end of mowing Relieving factors: lying down   PRECAUTIONS: None  RED FLAGS: None   WEIGHT BEARING RESTRICTIONS: No  FALLS:  Has patient fallen in last 6 months? No  LIVING ENVIRONMENT: Lives with: lives alone Lives in: House/apartment  OCCUPATION: Janitor   PLOF: Independent  PATIENT GOALS: to get techniques to keep it from coming back   OBJECTIVE:  Note: Objective measures were completed at Evaluation unless otherwise noted.  DIAGNOSTIC FINDINGS:  EXAM: 2 OR MORE VIEW(S) XRAY OF THE PELVIS 08/17/2024 11:06:31 AM IMPRESSION: 1. No acute abnormalities. 2. Mild right hip osteoarthritis. No significant left hip arthropathy. 3. Sclerosis  about the left sacroiliac joint consistent with osteoarthritis. 4. Mild degenerative changes in the lower lumbar spine. 5. Mild widening of the symphysis pubis with associated sclerosis and slight productive change, presumably chronic.  EXAM: 4 VIEW(S) XRAY OF THE LUMBAR SPINE 08/17/2024 11:06:31 AM IMPRESSION: 1. Moderate multilevel lumbar degenerative disc disease, most prominent at L4-5 2. No significant spondylolisthesis.  PATIENT SURVEYS:  Modified Oswestry:  MODIFIED OSWESTRY DISABILITY SCALE  Date: 09/01/24 Score  Pain intensity 0 = I can tolerate the pain I have without having to use pain medication.  2. Personal care (washing, dressing, etc.) 0 =  I can take care of myself normally without causing increased pain.  3. Lifting 0 = I can lift heavy weights without increased pain.  4. Walking 1 = Pain prevents me from walking more than 1 mile.  5. Sitting 0 =  I can sit in any chair as long as I like.  6. Standing 0 =  I can stand as long as I want without increased pain.  7. Sleeping 0 = Pain does not prevent me from sleeping well.  8. Social Life 0 = My social life is normal and does not increase my pain.  9. Traveling 0 =  I can travel anywhere without increased pain.  10. Employment/ Homemaking 1 = My normal homemaking/job activities increase my pain, but I can still perform all that is required of me  Total 2/50 = 4%    Interpretation of scores: Score Category Description  0-20% Minimal Disability The patient can cope with most living activities. Usually no treatment is indicated apart from advice on lifting, sitting and exercise  21-40% Moderate Disability The patient experiences more pain and difficulty with sitting, lifting and standing. Travel and social life are more difficult and they may be disabled from work. Personal care, sexual activity and sleeping are not grossly affected, and the patient can usually be managed by conservative means  41-60% Severe Disability  Pain remains the main problem in this group, but activities of daily living are affected.  These patients require a detailed investigation  61-80% Crippled Back pain impinges on all aspects of the patient's life. Positive intervention is required  81-100% Bed-bound These patients are either bed-bound or exaggerating their symptoms  Bluford FORBES Zoe DELENA Karon DELENA, et al. Surgery versus conservative management of stable thoracolumbar fracture: the PRESTO feasibility RCT. [place] ([place]): [employer]; 2021 Nov. [facility] Technology Assessment, No. 25.62.) Appendix 3, Oswestry Disability Index category descriptors. Available from: FindJewel[employer].cz  Minimally Clinically Important Difference (MCID) = 12.8%  COGNITION: Overall cognitive status: Within functional limits for tasks assessed     SENSATION: WFL   POSTURE: No Significant postural limitations  PALPATION: NT  LUMBAR ROM:   AROM eval  Flexion WFL  Extension WFL   Right lateral flexion WFL   Left lateral flexion WFL   Right rotation WFL   Left rotation WFL    (Blank rows = not tested)  LOWER EXTREMITY ROM:     WFL  LOWER EXTREMITY MMT:    MMT Right eval Left eval  Hip flexion 5 5  Hip extension    Hip abduction 5 5  Hip adduction 5 5  Hip internal rotation    Hip external rotation    Knee flexion 5 5  Knee extension 5 5  Ankle dorsiflexion 5 5  Ankle plantarflexion    Ankle inversion    Ankle eversion     (Blank rows = not tested)  LUMBAR SPECIAL TESTS:  Straight leg raise test: Negative and FABER test: Negative  FUNCTIONAL TESTS:  5 times sit to stand: to be completed visit #2   GAIT: Distance walked: 40'  Assistive device utilized: None Level of assistance: Complete Independence Comments: no significant deviations   TREATMENT DATE: 09/01/24                                                                                                                                  PATIENT EDUCATION:  Education details: HEP, POC, goals  Person educated: Patient Education method: Explanation, Demonstration, and Handouts Education comprehension: verbalized understanding and returned demonstration  HOME EXERCISE PROGRAM: Access Code: [email or web address] URL: [email or web address] Date: 09/01/2024 Prepared by: Maryanne Finder  Exercises - Supine Lower Trunk Rotation  - 2-3 x daily - 5-7 x weekly - 10 reps - 5-10 second hold - Sidelying Thoracic Rotation with Open Book  - 2-3 x daily - 5-7 x weekly - 10 reps - 5-10 second hold - Supine Figure 4 Piriformis Stretch  - 2-3 x daily - 5-7 x weekly - 3-5 reps - 30-60 second hold - Seated Piriformis Stretch  - 2-3 x daily - 5-7 x weekly - 3-5 reps - 30-60 second hold - Seated Hamstring Stretch  - 2-3 x daily - 5-7 x weekly - 3-5 reps - 30-60 second  hold  ASSESSMENT:  CLINICAL IMPRESSION: Patient is a 78 y.o. male who was seen today for physical therapy evaluation and treatment for L  sided sciatica. Patient presents with muscular tightness in hamstrings, L>R piriformis, lumbar paraspinals, and thoracic paraspinals. Strength is overall WFL. Patient is seeking PT assistance for stretching to minimize pain during his weekly walks. HEP initiated with stretching focused on the above muscle groups. Patient will benefit from skilled PT services to address listed impairments to improve tolerance to walking and decrease pain.    OBJECTIVE IMPAIRMENTS: decreased activity tolerance, decreased strength, impaired flexibility, postural dysfunction, and pain.   ACTIVITY LIMITATIONS: lifting, sitting, squatting, and locomotion level  PARTICIPATION LIMITATIONS: cleaning, community activity, occupation, and yard work  PERSONAL FACTORS: Age, Behavior pattern, Fitness, and Past/current experiences are also affecting patient's functional outcome.   REHAB POTENTIAL: Good  CLINICAL DECISION MAKING: Stable/uncomplicated  EVALUATION  COMPLEXITY: Low   GOALS: Goals reviewed with patient? Yes  SHORT TERM GOALS: Target date: 09/29/2024  Patient will be independent in HEP to improve strength/mobility for better functional independence with ADLs. Baseline: 09/01/24: HEP initiated  Goal status: INITIAL   LONG TERM GOALS: Target date: 10/27/2024  Patient will reduce modified Oswestry score to <20 as to demonstrate minimal disability with ADLs including improved sleeping tolerance, walking/sitting tolerance etc for better mobility with ADLs. Baseline: 09/01/24: 2/50 = 4%  Goal status: DEFERRED   2.  Patient will report a worst pain of 3/10 on NRPS in low back to improve tolerance with ADLs and reduced symptoms with activities.  Baseline: 09/01/24: 8-9/10 Goal status: INITIAL  3.  Patient will improve five times sit to stand time by 5 seconds indicating an increased LE strength and improved balance. Baseline: 09/01/24: to be completed visit #2   Goal status: INITIAL  4.  Patient will report being able to walk normal path at home at >2.5 mph to demonstrate improved tolerance to activity  Baseline: 09/01/24: reports able to walk 2 mph at this time  Goal status: INITIAL  PLAN:  PT FREQUENCY: 1-2x/week  PT DURATION: 8 weeks  PLANNED INTERVENTIONS: 97164- PT Re-evaluation, 97750- Physical Performance Testing, 97110-Therapeutic exercises, 97530- Therapeutic activity, [id number]- Neuromuscular re-education, 97535- Self Care, [id number]- Manual therapy, [id number]- Gait training, [phone]- Electrical stimulation (unattended), [id number]- Electrical stimulation (manual), 20560 (1-2 muscles), 20561 (3+ muscles)- Dry Needling, Patient/Family education, Stair training, Spinal manipulation, Spinal mobilization, Cryotherapy, and Moist heat.  PLAN FOR NEXT SESSION: review HEP, stretching focus, core strengthening   Maryanne Finder, PT, DPT Physical Therapist - [place]  [facility] 09/01/2024, 12:59 PM

## 2024-08-31 NOTE — Telephone Encounter (Signed)
 Called pt was unable to lvm due to vm being full

## 2024-09-01 ENCOUNTER — Ambulatory Visit: Attending: Family

## 2024-09-01 ENCOUNTER — Ambulatory Visit

## 2024-09-01 DIAGNOSIS — M6281 Muscle weakness (generalized): Secondary | ICD-10-CM | POA: Insufficient documentation

## 2024-09-01 DIAGNOSIS — M5432 Sciatica, left side: Secondary | ICD-10-CM | POA: Diagnosis present

## 2024-09-01 NOTE — Telephone Encounter (Signed)
 Spoke to pt explained results to him he verbalized understanding

## 2024-09-05 NOTE — Therapy (Signed)
 OUTPATIENT PHYSICAL THERAPY THORACOLUMBAR TREATMENT   Patient Name: Henry Skinner MRN: [id number] DOB:1946/04/05, 78 y.o., male Today's Date: 09/05/2024  END OF SESSION:    Past Medical History:  Diagnosis Date   Chickenpox    Hypertension    Kidney stones    Skin irritation    Past Surgical History:  Procedure Laterality Date   COLONOSCOPY WITH PROPOFOL  N/A 08/30/2019   Procedure: COLONOSCOPY WITH PROPOFOL ;  Surgeon: Therisa Bi, MD;  Location: [facility] ENDOSCOPY;  Service: Gastroenterology;  Laterality: N/A;   lesion removed from arm Left 04/2024   PAROTIDECTOMY Left 03/04/2016   Procedure: PAROTIDECTOMY;  Surgeon: Chinita Hasten, MD;  Location: [facility] ORS;  Service: ENT;  Laterality: Left;   ROTATOR CUFF REPAIR  03/2015   SKIN SURGERY     L hand, L arm/shoulder    Patient Active Problem List   Diagnosis Date Noted   Sciatica of left side 08/11/2024   Sleeping difficulty 06/06/2024   Encounter for general adult medical examination with abnormal findings 11/05/2022   Muscle cramps 11/05/2022   Shoulder joint pain 05/06/2022   Rib pain 05/06/2022   Leg swelling 10/08/2020   Skin lesion 08/24/2019   Prediabetes 03/23/2019   BPH (benign prostatic hyperplasia) 02/17/2018   Family history of colon cancer 02/17/2018   Essential hypertension 01/06/2017   Skin cancer 01/06/2017   Actinic keratoses 07/03/2016   Parotid mass 02/01/2016   Overweight 01/08/2016   History of kidney stones 01/08/2016    PCP: Gretel App, NP  REFERRING PROVIDER: Dineen Rollene MATSU, FNP  REFERRING DIAG: [phone] (ICD-10-CM) - Sciatica of left side  Rationale for Evaluation and Treatment: Rehabilitation  THERAPY DIAG:  Muscle weakness (generalized)  Sciatica, left side  ONSET DATE: 8 weeks ago   SUBJECTIVE:                                                                                                                                                                                            SUBJECTIVE STATEMENT: Since 2004, he has been going to hike the [facility]. He now walks the side of the road 3-4 times/week. He works as a Copy and frequently mopping. In the past 6 years, he has had 2 cases of sciatica where the pain goes down the L leg. He has been stretching to help. This current episode has been going on for 10 weeks. The pain currently does not go past the L glute. MD started him on steroid which didn't help. He then received a referral to PT.   PERTINENT HISTORY:  Per MD note on 08/17/2024, Left gluteal pain and left inner groin x 6  weeks ago , waxes and wanes  Worse with mopping. Stiffness with getting up in the morning. Pain improves with walking and stretching.  He can sleep on left hip Describes as ache , throbbing.  PAIN:  Are you having pain? Yes: NPRS scale: 1/10 current; 8-9/10 Pain location: L glute  Pain description: cramping Aggravating factors: sitting unsupported, at end of mowing Relieving factors: lying down   PRECAUTIONS: None  RED FLAGS: None   WEIGHT BEARING RESTRICTIONS: No  FALLS:  Has patient fallen in last 6 months? No  LIVING ENVIRONMENT: Lives with: lives alone Lives in: House/apartment  OCCUPATION: Janitor   PLOF: Independent  PATIENT GOALS: to get techniques to keep it from coming back   OBJECTIVE:  Note: Objective measures were completed at Evaluation unless otherwise noted.  DIAGNOSTIC FINDINGS:  EXAM: 2 OR MORE VIEW(S) XRAY OF THE PELVIS 08/17/2024 11:06:31 AM IMPRESSION: 1. No acute abnormalities. 2. Mild right hip osteoarthritis. No significant left hip arthropathy. 3. Sclerosis about the left sacroiliac joint consistent with osteoarthritis. 4. Mild degenerative changes in the lower lumbar spine. 5. Mild widening of the symphysis pubis with associated sclerosis and slight productive change, presumably chronic.  EXAM: 4 VIEW(S) XRAY OF THE LUMBAR SPINE 08/17/2024 11:06:31 AM IMPRESSION: 1. Moderate  multilevel lumbar degenerative disc disease, most prominent at L4-5 2. No significant spondylolisthesis.  PATIENT SURVEYS:  Modified Oswestry:  MODIFIED OSWESTRY DISABILITY SCALE  Date: 09/01/24 Score  Pain intensity 0 = I can tolerate the pain I have without having to use pain medication.  2. Personal care (washing, dressing, etc.) 0 =  I can take care of myself normally without causing increased pain.  3. Lifting 0 = I can lift heavy weights without increased pain.  4. Walking 1 = Pain prevents me from walking more than 1 mile.  5. Sitting 0 =  I can sit in any chair as long as I like.  6. Standing 0 =  I can stand as long as I want without increased pain.  7. Sleeping 0 = Pain does not prevent me from sleeping well.  8. Social Life 0 = My social life is normal and does not increase my pain.  9. Traveling 0 =  I can travel anywhere without increased pain.  10. Employment/ Homemaking 1 = My normal homemaking/job activities increase my pain, but I can still perform all that is required of me  Total 2/50 = 4%    Interpretation of scores: Score Category Description  0-20% Minimal Disability The patient can cope with most living activities. Usually no treatment is indicated apart from advice on lifting, sitting and exercise  21-40% Moderate Disability The patient experiences more pain and difficulty with sitting, lifting and standing. Travel and social life are more difficult and they may be disabled from work. Personal care, sexual activity and sleeping are not grossly affected, and the patient can usually be managed by conservative means  41-60% Severe Disability Pain remains the main problem in this group, but activities of daily living are affected. These patients require a detailed investigation  61-80% Crippled Back pain impinges on all aspects of the patient's life. Positive intervention is required  81-100% Bed-bound These patients are either bed-bound or exaggerating their symptoms   Bluford FORBES Zoe DELENA Karon DELENA, et al. Surgery versus conservative management of stable thoracolumbar fracture: the PRESTO feasibility RCT. [place] ([place]): [employer]; 2021 Nov. [facility] Technology Assessment, No. 25.62.) Appendix 3, Oswestry Disability Index category descriptors. Available from: FindJewel[employer].cz  Minimally Clinically Important Difference (MCID) = 12.8%  COGNITION: Overall cognitive status: Within functional limits for tasks assessed     SENSATION: WFL   POSTURE: No Significant postural limitations  PALPATION: NT  LUMBAR ROM:   AROM eval  Flexion WFL  Extension WFL   Right lateral flexion WFL   Left lateral flexion WFL   Right rotation WFL   Left rotation WFL    (Blank rows = not tested)  LOWER EXTREMITY ROM:     WFL  LOWER EXTREMITY MMT:    MMT Right eval Left eval  Hip flexion 5 5  Hip extension    Hip abduction 5 5  Hip adduction 5 5  Hip internal rotation    Hip external rotation    Knee flexion 5 5  Knee extension 5 5  Ankle dorsiflexion 5 5  Ankle plantarflexion    Ankle inversion    Ankle eversion     (Blank rows = not tested)  LUMBAR SPECIAL TESTS:  Straight leg raise test: Negative and FABER test: Negative  FUNCTIONAL TESTS:  5 times sit to stand: to be completed visit #2   GAIT: Distance walked: 106'  Assistive device utilized: None Level of assistance: Complete Independence Comments: no significant deviations   TREATMENT DATE: 09/05/24                                                                                                                               Subjective: ***     PATIENT EDUCATION:  Education details: HEP, POC, goals  Person educated: Patient Education method: Explanation, Demonstration, and Handouts Education comprehension: verbalized understanding and returned demonstration  HOME EXERCISE PROGRAM: Access Code: [email or web address] URL:  [email or web address] Date: 09/01/2024 Prepared by: Maryanne Finder  Exercises - Supine Lower Trunk Rotation  - 2-3 x daily - 5-7 x weekly - 10 reps - 5-10 second hold - Sidelying Thoracic Rotation with Open Book  - 2-3 x daily - 5-7 x weekly - 10 reps - 5-10 second hold - Supine Figure 4 Piriformis Stretch  - 2-3 x daily - 5-7 x weekly - 3-5 reps - 30-60 second hold - Seated Piriformis Stretch  - 2-3 x daily - 5-7 x weekly - 3-5 reps - 30-60 second hold - Seated Hamstring Stretch  - 2-3 x daily - 5-7 x weekly - 3-5 reps - 30-60 second  hold  ASSESSMENT:  CLINICAL IMPRESSION: ***  Patient is a 78 y.o. male who was seen today for physical therapy evaluation and treatment for L sided sciatica. Patient presents with muscular tightness in hamstrings, L>R piriformis, lumbar paraspinals, and thoracic paraspinals. Strength is overall WFL. Patient is seeking PT assistance for stretching to minimize pain during his weekly walks. HEP initiated with stretching focused on the above muscle groups. Patient will benefit from skilled PT services to address listed impairments to improve tolerance to walking and decrease pain.    OBJECTIVE IMPAIRMENTS: decreased activity tolerance, decreased strength,  impaired flexibility, postural dysfunction, and pain.   ACTIVITY LIMITATIONS: lifting, sitting, squatting, and locomotion level  PARTICIPATION LIMITATIONS: cleaning, community activity, occupation, and yard work  PERSONAL FACTORS: Age, Behavior pattern, Fitness, and Past/current experiences are also affecting patient's functional outcome.   REHAB POTENTIAL: Good  CLINICAL DECISION MAKING: Stable/uncomplicated  EVALUATION COMPLEXITY: Low   GOALS: Goals reviewed with patient? Yes  SHORT TERM GOALS: Target date: 09/29/2024  Patient will be independent in HEP to improve strength/mobility for better functional independence with ADLs. Baseline: 09/01/24: HEP initiated  Goal status:  INITIAL   LONG TERM GOALS: Target date: 10/27/2024  Patient will reduce modified Oswestry score to <20 as to demonstrate minimal disability with ADLs including improved sleeping tolerance, walking/sitting tolerance etc for better mobility with ADLs. Baseline: 09/01/24: 2/50 = 4%  Goal status: DEFERRED   2.  Patient will report a worst pain of 3/10 on NRPS in low back to improve tolerance with ADLs and reduced symptoms with activities.  Baseline: 09/01/24: 8-9/10 Goal status: INITIAL  3.  Patient will improve five times sit to stand time by 5 seconds indicating an increased LE strength and improved balance. Baseline: 09/01/24: to be completed visit #2   Goal status: INITIAL  4.  Patient will report being able to walk normal path at home at >2.5 mph to demonstrate improved tolerance to activity  Baseline: 09/01/24: reports able to walk 2 mph at this time  Goal status: INITIAL  PLAN:  PT FREQUENCY: 1-2x/week  PT DURATION: 8 weeks  PLANNED INTERVENTIONS: 97164- PT Re-evaluation, 97750- Physical Performance Testing, 97110-Therapeutic exercises, 97530- Therapeutic activity, [id number]- Neuromuscular re-education, 97535- Self Care, 02859- Manual therapy, [id number]- Gait training, [id number]- Electrical stimulation (unattended), [id number]- Electrical stimulation (manual), 20560 (1-2 muscles), 20561 (3+ muscles)- Dry Needling, Patient/Family education, Stair training, Spinal manipulation, Spinal mobilization, Cryotherapy, and Moist heat.  PLAN FOR NEXT SESSION: review HEP, stretching focus, core strengthening   Maryanne Finder, PT, DPT Physical Therapist - [facility] 09/05/2024, 12:28 PM

## 2024-09-06 ENCOUNTER — Ambulatory Visit

## 2024-09-06 DIAGNOSIS — M5432 Sciatica, left side: Secondary | ICD-10-CM

## 2024-09-06 DIAGNOSIS — M6281 Muscle weakness (generalized): Secondary | ICD-10-CM

## 2024-09-08 ENCOUNTER — Ambulatory Visit

## 2024-09-13 ENCOUNTER — Ambulatory Visit

## 2024-09-15 ENCOUNTER — Encounter

## 2024-09-20 ENCOUNTER — Encounter

## 2024-09-22 ENCOUNTER — Encounter

## 2024-09-27 ENCOUNTER — Encounter

## 2024-09-28 ENCOUNTER — Encounter: Payer: Self-pay | Admitting: Family

## 2024-09-28 ENCOUNTER — Ambulatory Visit (INDEPENDENT_AMBULATORY_CARE_PROVIDER_SITE_OTHER): Admitting: Family

## 2024-09-28 VITALS — BP 120/74 | HR 76 | Temp 97.7°F | Ht 67.0 in | Wt 153.4 lb

## 2024-09-28 DIAGNOSIS — M5432 Sciatica, left side: Secondary | ICD-10-CM

## 2024-09-28 NOTE — Progress Notes (Unsigned)
   Assessment & Plan:  There are no diagnoses linked to this encounter.   Return precautions given.   Risks, benefits, and alternatives of the medications and treatment plan prescribed today were discussed, and patient expressed understanding.   Education regarding symptom management and diagnosis given to patient on AVS either electronically or printed.  No follow-ups on file.  Rollene Northern, FNP  Subjective:    Patient ID: Henry Skinner, male    DOB: 13-Jun-1946, 78 y.o.   MRN: [id number]  CC: Henry Skinner is a 78 y.o. male who presents today for follow up.   HPI: Left low back pain has resolved. He has done some excercises as recommend by PT.  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  Denies numbness   Follow-up after visit 08/17/2024 for sciatica of the left side. He has been discharged from PT. Allergies: Patient has no known allergies. Current Outpatient Medications on File Prior to Visit  Medication Sig Dispense Refill   b complex vitamins tablet Take 1 tablet by mouth daily.     Coenzyme Q10 (COQ-10 PO) Take by mouth daily.     Elastic Bandages & Supports (MEDICAL COMPRESSION THIGH HIGH) MISC Apply in the morning and remove at bedtime. 2 each 3   GARLIC PO Take by mouth daily.     losartan  (COZAAR ) 50 MG tablet Take 1 tablet (50 mg total) by mouth daily. 90 tablet 3   methylPREDNISolone  (MEDROL  DOSEPAK) 4 MG TBPK tablet Take as directed. 21 each 0   Omega-3 Fatty Acids (FISH OIL) 500 MG CAPS Take by mouth.     Specialty Vitamins Products (PROSTATE PO) Take by mouth. Force Factor Brand     TURMERIC PO Take 1 tablet by mouth at bedtime.     vitamin E 1000 UNIT capsule Take 1,000 Units by mouth daily.     No current facility-administered medications on file prior to visit.    Review of Systems    Objective:    BP 120/74   Pulse 76   Temp 97.7 F (36.5 C)   Ht 5' 7 (1.702 m)   Wt  153 lb 6.4 oz (69.6 kg)   SpO2 97%   BMI 24.03 kg/m  BP Readings from Last 3 Encounters:  09/28/24 120/74  08/17/24 128/78  08/11/24 130/72   Wt Readings from Last 3 Encounters:  09/28/24 153 lb 6.4 oz (69.6 kg)  08/17/24 156 lb (70.8 kg)  08/11/24 154 lb 12.8 oz (70.2 kg)    Physical Exam

## 2024-09-29 ENCOUNTER — Encounter

## 2024-09-30 NOTE — Assessment & Plan Note (Signed)
 Fortunately back pain, radicular symptoms have resolved.  Will continue to monitor.  Patient will continue PT exercises at home and let me know of any concerns, recurrence.

## 2024-10-04 ENCOUNTER — Encounter

## 2024-10-06 ENCOUNTER — Encounter

## 2024-10-10 ENCOUNTER — Encounter

## 2024-11-02 ENCOUNTER — Ambulatory Visit (INDEPENDENT_AMBULATORY_CARE_PROVIDER_SITE_OTHER)

## 2024-11-02 ENCOUNTER — Ambulatory Visit
Admission: EM | Admit: 2024-11-02 | Discharge: 2024-11-02 | Disposition: A | Discharge status: Home / Self Care | Source: Home / Self Care

## 2024-11-02 DIAGNOSIS — M25571 Pain in right ankle and joints of right foot: Secondary | ICD-10-CM

## 2024-11-02 DIAGNOSIS — M79671 Pain in right foot: Secondary | ICD-10-CM

## 2024-11-02 NOTE — Discharge Instructions (Addendum)
 Follow-up with a podiatrist such as the one listed below   Or   [facility] [address] [place], [place] [address] Phone: [phone]    Wear the walking boot.  Take Tylenol  or ibuprofen as needed for discomfort.  Rest and elevate your foot.

## 2024-11-02 NOTE — ED Provider Notes (Signed)
 [facility]    CSN: [id number] Arrival date & time: 11/02/24  1500      History   Chief Complaint Chief Complaint  Patient presents with   Foot Pain    HPI Henry Skinner is a 78 y.o. male.  Patient presents with 67-month history of right foot and ankle pain which primarily occurs after he works 7-hour shift on his feet on concrete floor.  His pain worsened yesterday.  No falls or injury.  No numbness, weakness, bruising, redness, wounds, swelling.  He has been treating his pain with warmth, CBD oil, topical patch.  He reports no pertinent medical history.  He walks several times a week for exercise and recreation without difficulty.  The history is provided by the patient and medical records.    Past Medical History:  Diagnosis Date   Chickenpox    Hypertension    Kidney stones    Skin irritation     Patient Active Problem List   Diagnosis Date Noted   Sciatica of left side 08/11/2024   Sleeping difficulty 06/06/2024   Encounter for general adult medical examination with abnormal findings 11/05/2022   Muscle cramps 11/05/2022   Shoulder joint pain 05/06/2022   Rib pain 05/06/2022   Leg swelling 10/08/2020   Skin lesion 08/24/2019   Prediabetes 03/23/2019   BPH (benign prostatic hyperplasia) 02/17/2018   Family history of colon cancer 02/17/2018   Essential hypertension 01/06/2017   Skin cancer 01/06/2017   Actinic keratoses 07/03/2016   Parotid mass 02/01/2016   Overweight 01/08/2016   History of kidney stones 01/08/2016    Past Surgical History:  Procedure Laterality Date   COLONOSCOPY WITH PROPOFOL  N/A 08/30/2019   Procedure: COLONOSCOPY WITH PROPOFOL ;  Surgeon: Therisa Bi, MD;  Location: [facility] ENDOSCOPY;  Service: Gastroenterology;  Laterality: N/A;   lesion removed from arm Left 04/2024   PAROTIDECTOMY Left 03/04/2016   Procedure: PAROTIDECTOMY;  Surgeon: Chinita Hasten, MD;  Location: [facility] ORS;  Service: ENT;  Laterality: Left;   ROTATOR CUFF  REPAIR  03/2015   SKIN SURGERY     L hand, L arm/shoulder        Home Medications    Prior to Admission medications  Medication Sig Start Date End Date Taking? Authorizing Provider  b complex vitamins tablet Take 1 tablet by mouth daily.    [provider]  Coenzyme Q10 (COQ-10 PO) Take by mouth daily.    [provider]  Elastic Bandages & Supports (MEDICAL COMPRESSION THIGH HIGH) MISC Apply in the morning and remove at bedtime. 11/20/20   Maribeth Camellia MATSU, MD  GARLIC PO Take by mouth daily.    [provider]  losartan  (COZAAR ) 50 MG tablet Take 1 tablet (50 mg total) by mouth daily. 05/31/24   Gretel App, NP  Omega-3 Fatty Acids (FISH OIL) 500 MG CAPS Take by mouth.    [provider]  Specialty Vitamins Products (PROSTATE PO) Take by mouth. Force Factor Brand    [provider]  TURMERIC PO Take 1 tablet by mouth at bedtime.    [provider]  vitamin E 1000 UNIT capsule Take 1,000 Units by mouth daily.    [provider]    Family History Family History  Problem Relation Age of Onset   Alcoholism Brother    Colon cancer Maternal Uncle     Social History Social History[1]   Allergies   Patient has no known allergies.   Review of Systems Review of Systems  Constitutional:  Negative for chills and fever.  Musculoskeletal:  Positive for arthralgias and gait problem. Negative for joint swelling.  Skin:  Negative for color change, rash and wound.  Neurological:  Negative for weakness and numbness.     Physical Exam Triage Vital Signs ED Triage Vitals [11/02/24 1514]  Encounter Vitals Group     BP 139/85     Girls Systolic BP Percentile      Girls Diastolic BP Percentile      Boys Systolic BP Percentile      Boys Diastolic BP Percentile      Pulse Rate 81     Resp 18     Temp 97.7 F (36.5 C)     Temp src      SpO2 98 %     Weight      Height      Head Circumference      Peak Flow       Pain Score      Pain Loc      Pain Education      Exclude from Growth Chart    No data found.  Updated Vital Signs BP 139/85   Pulse 81   Temp 97.7 F (36.5 C)   Resp 18   SpO2 98%   Visual Acuity Right Eye Distance:   Left Eye Distance:   Bilateral Distance:    Right Eye Near:   Left Eye Near:    Bilateral Near:     Physical Exam Constitutional:      General: He is not in acute distress. HENT:     Mouth/Throat:     Mouth: Mucous membranes are moist.  Cardiovascular:     Rate and Rhythm: Normal rate.  Pulmonary:     Effort: Pulmonary effort is normal. No respiratory distress.  Musculoskeletal:        General: Tenderness present. No swelling or deformity. Normal range of motion.       Feet:     Comments: RLE: FROM, strength 5/5, sensation intact, 2+ pedal pulse.  Skin:    General: Skin is warm and dry.     Capillary Refill: Capillary refill takes less than 2 seconds.     Findings: No bruising, erythema, lesion or rash.  Neurological:     General: No focal deficit present.     Mental Status: He is alert.     Sensory: No sensory deficit.     Motor: No weakness.     Gait: Gait normal.      UC Treatments / Results  Labs (all labs ordered are listed, but only abnormal results are displayed) Labs Reviewed - No data to display  EKG   Radiology DG Ankle Complete Right Result Date: 11/02/2024 CLINICAL DATA:  Right ankle pain. EXAM: RIGHT ANKLE - COMPLETE 3+ VIEW COMPARISON:  None Available. FINDINGS: There is no evidence of fracture, dislocation, or joint effusion. There is no evidence of arthropathy or other focal bone abnormality. Soft tissues are unremarkable. IMPRESSION: Negative. Electronically Signed   By: Vanetta Chou M.D.   On: 11/02/2024 15:54    Procedures Procedures (including critical care time)  Medications Ordered in UC Medications - No data to display  Initial Impression / Assessment and Plan / UC Course  I have reviewed the triage  vital signs and the nursing notes.  Pertinent labs & imaging results that were available during my care of the patient were reviewed by me and considered in my medical decision making (see  chart for details).   Right ankle and foot pain.  Xray negative.  Treating today with walking boot, rest, elevation, Tylenol  or ibuprofen as needed.  Instructed patient to follow-up with a podiatrist.  Contact information for on-call podiatrist provided as well as contact information for [facility].  Education provided on foot pain and ankle pain.  Patient agrees to plan of care.   Final Clinical Impressions(s) / UC Diagnoses   Final diagnoses:  Acute right ankle pain  Right foot pain     Discharge Instructions      Follow-up with a podiatrist such as the one listed below   Or   [facility] [address], [place], [place] [address] Phone: [phone]    Wear the walking boot.  Take Tylenol  or ibuprofen as needed for discomfort.  Rest and elevate your foot.       ED Prescriptions   None    PDMP not reviewed this encounter.    [1]  Social History Tobacco Use   Smoking status: Never   Smokeless tobacco: Never  Vaping Use   Vaping status: Never Used  Substance Use Topics   Alcohol use: Yes    Alcohol/week: 1.0 standard drink of alcohol    Types: 1 Standard drinks or equivalent per week    Comment: 1 beer daily   Drug use: Yes    Types: Marijuana    Comment: marijuana, rarely. Last use 11/2018     Corlis Burnard DEL, NP 11/02/24 [phone]

## 2024-11-02 NOTE — ED Triage Notes (Signed)
 Patient to Urgent Care with complaints of right sided foot pain. Reports he works long shifts on his feet and goes on multiple walks a day. Pain on the top of his foot.   Symptoms x2 months. Worsened yesterday.   Denies any known injury.

## 2024-11-04 ENCOUNTER — Ambulatory Visit

## 2024-11-04 ENCOUNTER — Ambulatory Visit: Admitting: Podiatry

## 2024-11-04 DIAGNOSIS — M7989 Other specified soft tissue disorders: Secondary | ICD-10-CM

## 2024-11-04 DIAGNOSIS — M7751 Other enthesopathy of right foot: Secondary | ICD-10-CM

## 2024-11-04 DIAGNOSIS — M778 Other enthesopathies, not elsewhere classified: Secondary | ICD-10-CM

## 2024-11-04 MED ORDER — DICLOFENAC SODIUM 1 % EX GEL: 2.0000 g | Freq: Four times a day (QID) | CUTANEOUS | 2 refills | Status: AC

## 2024-11-08 NOTE — Progress Notes (Signed)
 "  Subjective:  Patient ID: Henry Skinner, male    DOB: [birth year],  MRN: [id number]  Chief Complaint  Patient presents with   Foot Pain    Right foot anterior ankle pain x 2 months. 2 pain. Tried heat to alleviate pain    Discussed the use of AI scribe software for clinical note transcription with the patient, who gave verbal consent to proceed.  History of Present Illness Henry Skinner is a 78 year old male with ongoing right ankle pain.   He has had localized right ankle pain for about two months without trauma. Pain is focal, fluctuates during the day, improves with warmth and overnight rest, and is least severe in the morning, especially in supportive hiking boots. He can walk 5-8 hours before pain returns and requires rest and warming measures. Pain now limits his prior routine of walking at least 1.5 miles four times weekly. His overall activity has decreased over the past 3-4 months due to this ankle pain. Prior left-sided sciatica has resolved.  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 He previously used a ski boot-style brace from urgent care.  Urgent care recommended acetaminophen  or NSAIDs, but he avoids these due to concern for gastrointestinal side effects. He takes an unspecified oral analgesic and has not used topical anti-inflammatory agents. 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      Objective:  There were no vitals filed for this visit.  Physical Exam General: AAO x3, NAD  Dermatological: Skin is warm, dry and supple bilateral. There are no open sores, no preulcerative lesions, no rash or signs of infection present.  Vascular: Dorsalis Pedis artery and Posterior Tibial artery pedal pulses are 2/4 bilateral with immedate capillary fill time. There is no pain with calf compression, swelling, warmth, erythema.    Neruologic: Grossly intact via light touch bilateral.   Musculoskeletal: On the anterior asp of the ankle is localized area of edema and feels like a fluid-filled mobile soft tissue mass on the anterior aspect the ankle.  This where he gets tenderness.  Clinically the extensor tendons appear to be intact.  There is no pain or crepitation with ankle joint range of motion.  No area of pinpoint tenderness.  MMT 5/5.     No images are attached to the encounter.    Results Radiology Right ankle X-ray (11/04/2024): No significant joint abnormality; mild degenerative changes.  On the lateral view of the foot there is an area of soft tissue density which could represent an area of a soft tissue mass.  (Independently interpreted)   Assessment:   1. Mass of soft tissue of ankle   2. Capsulitis of foot, right      Plan:  Patient was evaluated and treated and all questions answered.  Assessment and Plan Assessment & Plan Right ankle soft tissue mass Chronic right ankle pain with a palpable, fluid-filled mass, likely a cyst.  Discussed injection or aspiration.  Radiography showed no joint abnormality. Clinical findings suggest a soft tissue cyst. - Discussed Voltaren  gel for topical anti-inflammatory therapy, apply several times daily. - Recommended ankle brace or strap for support; demonstrated options. - Ordered MRI of the right ankle to evaluate the mass and assess for underlying pathology. - Provided MRI scheduling contact information and discussed insurance and cost considerations.    Return for MRI  results.   [name] Fees DPM  "

## 2024-11-18 ENCOUNTER — Ambulatory Visit
Admission: RE | Admit: 2024-11-18 | Discharge: 2024-11-18 | Disposition: A | Discharge status: Home / Self Care | Source: Ambulatory Visit | Attending: Podiatry | Admitting: Podiatry

## 2024-11-18 DIAGNOSIS — M7989 Other specified soft tissue disorders: Secondary | ICD-10-CM

## 2024-12-02 ENCOUNTER — Ambulatory Visit: Payer: Self-pay | Admitting: Podiatry

## 2024-12-06 ENCOUNTER — Ambulatory Visit: Admitting: Nurse Practitioner

## 2024-12-06 ENCOUNTER — Encounter: Payer: Self-pay | Admitting: Nurse Practitioner

## 2024-12-06 VITALS — BP 122/78 | HR 68 | Temp 98.2°F | Ht 67.0 in | Wt 155.8 lb

## 2024-12-06 DIAGNOSIS — I10 Essential (primary) hypertension: Secondary | ICD-10-CM | POA: Diagnosis not present

## 2024-12-06 DIAGNOSIS — M7989 Other specified soft tissue disorders: Secondary | ICD-10-CM | POA: Diagnosis not present

## 2024-12-06 DIAGNOSIS — R011 Cardiac murmur, unspecified: Secondary | ICD-10-CM

## 2024-12-06 DIAGNOSIS — R9431 Abnormal electrocardiogram [ECG] [EKG]: Secondary | ICD-10-CM | POA: Diagnosis not present

## 2024-12-06 NOTE — Assessment & Plan Note (Signed)
 Chronic pain since November may be due to a movable cyst. MRI results are pending. Podiatry consultation is ongoing. Await MRI results, continue topical Voltaren  as needed, and follow up with podiatry.

## 2024-12-06 NOTE — Assessment & Plan Note (Signed)
 Newly detected murmur. He has no symptoms of shortness of breath or chest pain. An EKG and echocardiogram is ordered. Referral placed to Cardiology.  He should seek immediate medical attention if experiencing chest pain, pressure, or shortness of breath.

## 2024-12-06 NOTE — Assessment & Plan Note (Signed)
 Blood pressure is well-controlled at 122/78 mmHg with losartan . Continue losartan  50 mg daily. Check BMP.

## 2024-12-06 NOTE — Progress Notes (Signed)
 " Leron Glance, NP-C Phone: [phone]  Henry Skinner is a 79 y.o. male who presents today for follow up.   Discussed the use of AI scribe software for clinical note transcription with the patient, who gave verbal consent to proceed.  History of Present Illness   Henry Skinner is a 79 year old male who presents with ongoing right foot pain and difficulty walking.  He has been experiencing immense right foot pain since November, particularly where the metatarsals meet the tarsal, which worsens after work. He uses Voltaren  gel, applying two grams topically twice a day, which has improved his walking duration from five to seven hours to eight to twelve hours without severe pain. Heating his foot under a wood stove provides temporary relief.  He reports sciatica in the greater gluteal region shortly after his last visit, leading to adjustments in his walking pattern and subsequent issues with his right leg. He has a history of regular walking since 2004, including walking the [facility], but has significantly reduced his walking frequency and distance since the onset of his symptoms. He typically walks three to four times a week, at least a mile and a half, but has only managed about fifteen walks of half the distance since his symptoms began.  He visited urgent care before Christmas for his ankle and was referred to podiatry, where he received an MRI of his ankle. He describes a sensation of a cyst or knot under the skin of his foot, which the podiatrist also noted.  He mentions a fall while dealing with a squirrel, attributing it to a mechanical issue rather than dizziness or imbalance. No chest pain, shortness of breath, or dizziness. He is currently taking losartan  for blood pressure management.      Tobacco Use History[1]  Medications Ordered Prior to Encounter[2]   ROS see history of present illness  Objective  Physical Exam Vitals:   12/06/24 1342  BP: 122/78   Pulse: 68  Temp: 98.2 F (36.8 C)  SpO2: 98%    BP Readings from Last 3 Encounters:  12/06/24 122/78  11/02/24 139/85  09/28/24 120/74   Wt Readings from Last 3 Encounters:  12/06/24 155 lb 12.8 oz (70.7 kg)  09/28/24 153 lb 6.4 oz (69.6 kg)  08/17/24 156 lb (70.8 kg)    Physical Exam Constitutional:      General: He is not in acute distress.    Appearance: Normal appearance.  HENT:     Head: Normocephalic.  Cardiovascular:     Rate and Rhythm: Normal rate and regular rhythm.     Heart sounds: Murmur heard.  Pulmonary:     Effort: Pulmonary effort is normal.     Breath sounds: Normal breath sounds.  Skin:    General: Skin is warm and dry.  Neurological:     General: No focal deficit present.     Mental Status: He is alert.  Psychiatric:        Mood and Affect: Mood normal.        Behavior: Behavior normal.      Assessment/Plan: Please see individual problem list.  Heart murmur Assessment & Plan: Newly detected murmur. He has no symptoms of shortness of breath or chest pain. An EKG and echocardiogram is ordered. Referral placed to Cardiology.  He should seek immediate medical attention if experiencing chest pain, pressure, or shortness of breath.  Orders: -     ECHOCARDIOGRAM COMPLETE; Future -     EKG 12-Lead -  Ambulatory referral to Cardiology  Mass of soft tissue of ankle Assessment & Plan: Chronic pain since November may be due to a movable cyst. MRI results are pending. Podiatry consultation is ongoing. Await MRI results, continue topical Voltaren  as needed, and follow up with podiatry.   Essential hypertension Assessment & Plan: Blood pressure is well-controlled at 122/78 mmHg with losartan . Continue losartan  50 mg daily. Check BMP.   Orders: -     Basic metabolic panel with GFR  Abnormal EKG -     Ambulatory referral to Cardiology     Return in about 6 months (around 06/05/2025) for Follow up.   Leron Glance, NP-C [facility] [place]       [1] Social History Tobacco Use  Smoking Status Never  Smokeless Tobacco Never  [2] Current Outpatient Medications on File Prior to Visit  Medication Sig Dispense Refill   b complex vitamins tablet Take 1 tablet by mouth daily.     Coenzyme Q10 (COQ-10 PO) Take by mouth daily.     diclofenac  Sodium (VOLTAREN ) 1 % GEL Apply 2 g topically 4 (four) times daily. Rub into affected area of foot 2 to 4 times daily 100 g 2   Elastic Bandages & Supports (MEDICAL COMPRESSION THIGH HIGH) MISC Apply in the morning and remove at bedtime. 2 each 3   GARLIC PO Take by mouth daily.     losartan  (COZAAR ) 50 MG tablet Take 1 tablet (50 mg total) by mouth daily. 90 tablet 3   Omega-3 Fatty Acids (FISH OIL) 500 MG CAPS Take by mouth.     Specialty Vitamins Products (PROSTATE PO) Take by mouth. Force Factor Brand     TURMERIC PO Take 1 tablet by mouth at bedtime.     vitamin E 1000 UNIT capsule Take 1,000 Units by mouth daily.     No current facility-administered medications on file prior to visit.  "

## 2024-12-06 NOTE — Telephone Encounter (Signed)
 Attempted to call patients with results. Left message to call back with call back number. Otherwise, I will try to call him tomorrow.

## 2024-12-07 LAB — BASIC METABOLIC PANEL WITH GFR
BUN: 21 mg/dL (ref 6–23)
CO2: 30 meq/L (ref 19–32)
Calcium: 9.5 mg/dL (ref 8.4–10.5)
Chloride: 102 meq/L (ref 96–112)
Creatinine, Ser: 0.85 mg/dL (ref 0.40–1.50)
GFR: 84.34 mL/min
Glucose, Bld: 98 mg/dL (ref 70–99)
Potassium: 4.3 meq/L (ref 3.5–5.1)
Sodium: 139 meq/L (ref 135–145)

## 2024-12-08 ENCOUNTER — Ambulatory Visit: Admitting: Podiatry

## 2024-12-08 ENCOUNTER — Ambulatory Visit: Payer: Self-pay | Admitting: Nurse Practitioner

## 2024-12-08 DIAGNOSIS — M7751 Other enthesopathy of right foot: Secondary | ICD-10-CM

## 2024-12-08 NOTE — Patient Instructions (Signed)
 While at your visit today you received a steroid injection in your foot or ankle to help with your pain. Along with having the steroid medication there is some numbing medication in the shot that you received. Due to this you may notice some numbness to the area for the next couple of hours.   I would recommend limiting activity for the next few days to help the steroid injection take affect.    The actually benefit from the steroid injection may take up to 2-7 days to see a difference. You may actually experience a small (as in 10%) INCREASE in pain in the first 24 hours---that is common. It would be best if you can ice the area today and take anti-inflammatory medications (such as Ibuprofen , Motrin , or Aleve) if you are able to take these medications. If you were prescribed another medication to help with the pain go ahead and start that medication today    Things to watch out for that you should contact us  or a health care provider urgently would include: 1. Unusual (as in more than 10%) increase in pain 2. New fever > 101.5 3. New swelling or redness of the injected area.  4. Streaking of red lines around the area injected.  If you have any questions or concerns about this, please give our office a call at [phone].    --  For instructions on how to put on your Tri-Lock Ankle Brace, please visit [email or web address]

## 2024-12-12 NOTE — Progress Notes (Signed)
"  °  Subjective:  Patient ID: Henry Skinner, male    DOB: June 30, 1946,  MRN: [id number] Chief Complaint  Patient presents with   INJECTION    Patient presents today for R foot injection , denies any pain at the moment.    History of Present Illness Henry Skinner is a 79 year old male with ongoing right ankle pain.  He presents today for an injection.  I contacted him with the results of the MRI and wants to proceed with a steroid injection today.  Previous history: He has had localized right ankle pain for about two months without trauma. Pain is focal, fluctuates during the day, improves with warmth and overnight rest, and is least severe in the morning, especially in supportive hiking boots. He can walk 5-8 hours before pain returns and requires rest and warming measures. Pain now limits his prior routine of walking at least 1.5 miles four times weekly. His overall activity has decreased over the past 3-4 months due to this ankle pain. Prior left-sided sciatica has resolved.  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 He previously used a ski boot-style brace from urgent care.  Urgent care recommended acetaminophen  or NSAIDs, but he avoids these due to concern for gastrointestinal side effects. He takes an unspecified oral analgesic and has not used topical anti-inflammatory agents. 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      Objective:  There were no vitals filed for this visit.  Physical Exam General: AAO x3, NAD  Dermatological: Skin is warm, dry and supple bilateral. There are no open sores, no preulcerative lesions, no rash or signs of infection present.  Vascular: Dorsalis Pedis artery and Posterior Tibial artery pedal pulses are 2/4 bilateral with immedate capillary fill time. There is no pain with calf compression, swelling, warmth,  erythema.   Neruologic: Grossly intact via light touch bilateral.   Musculoskeletal: On the anterior aspect the ankle not able to appreciate a soft tissue mass today however there is dorsal spurring present just inferior to the ankle and this does seem to be along the talar navicular joint.  There is no soft tissue mass identified today that was present on last exam.  There is no area of pinpoint tenderness.  MMT 5/5.   Assessment:   Arthritis, right  Plan:  Patient was evaluated and treated and all questions answered.  Assessment and Plan Assessment & Plan Right ankle soft tissue mass -Patient agrees to proceed with steroid injection.  I cleaned the skin with Betadine, alcohol.  Mixture of 0.5 cc of Marcaine plain, 0.5 cc of dexamethasone  phosphate was infiltrated into the area of maximal tenderness which is more along the dorsal spurring at the talonavicular joint area.  Care was taken not to inject into the extensor tendons.  Postinjection care discussed.  Tolerated well.  Offered ankle brace to wear for immobilization however he declined.  Discussed limited activity.  Return for right foot/anke pain 6-8 weeks.  [name] Fees DPM     Return for MRI results.   [name] Fees DPM "

## 2024-12-29 ENCOUNTER — Ambulatory Visit

## 2025-01-26 ENCOUNTER — Ambulatory Visit: Admitting: Podiatry

## 2025-05-08 ENCOUNTER — Ambulatory Visit

## 2025-06-06 ENCOUNTER — Ambulatory Visit: Admitting: Nurse Practitioner
